# Patient Record
Sex: Male | Born: 1980
Health system: Southern US, Community
[De-identification: ages and names within clinical notes are randomized; demographics above are authoritative.]

## PROBLEM LIST (undated history)

## (undated) DIAGNOSIS — U071 COVID-19: Secondary | ICD-10-CM

## (undated) DIAGNOSIS — Z800 Family history of malignant neoplasm of digestive organs: Secondary | ICD-10-CM

## (undated) DIAGNOSIS — R229 Localized swelling, mass and lump, unspecified: Secondary | ICD-10-CM

## (undated) DIAGNOSIS — I10 Essential (primary) hypertension: Secondary | ICD-10-CM

## (undated) DIAGNOSIS — G4730 Sleep apnea, unspecified: Secondary | ICD-10-CM

## (undated) DIAGNOSIS — R7303 Prediabetes: Secondary | ICD-10-CM

## (undated) HISTORY — DX: Localized swelling, mass and lump, unspecified: R22.9

## (undated) HISTORY — PX: VASECTOMY: SHX75

## (undated) HISTORY — PX: COLONOSCOPY: SHX174

## (undated) HISTORY — DX: Family history of malignant neoplasm of digestive organs: Z80.0

## (undated) HISTORY — PX: LIPOMA EXCISION: SHX5283

---

## 2009-03-05 HISTORY — PX: TOE SURGERY: SHX1073

## 2011-03-06 HISTORY — PX: BUNIONECTOMY: SHX129

## 2013-03-10 LAB — HEPATIC FUNCTION PANEL
ALT: 36 (ref 10–40)
AST: 19 (ref 14–40)
Bilirubin, Total: 0.6

## 2013-03-10 LAB — BASIC METABOLIC PANEL
BUN: 15 (ref 4–21)
CREATININE: 1 (ref 0.6–1.3)

## 2013-03-10 LAB — LIPID PANEL
Cholesterol: 213 — AB (ref 0–200)
HDL: 50 (ref 35–70)
TRIGLYCERIDES: 113 (ref 40–160)

## 2013-06-30 ENCOUNTER — Telehealth: Payer: Self-pay | Admitting: Genetic Counselor

## 2013-06-30 NOTE — Telephone Encounter (Signed)
CALLED PATIENT TO SCHEDULE GENETIC APPT PER PATIENT WILL CALL BACK.

## 2013-07-29 ENCOUNTER — Ambulatory Visit (INDEPENDENT_AMBULATORY_CARE_PROVIDER_SITE_OTHER): Payer: BC Managed Care – PPO | Admitting: General Surgery

## 2013-07-29 ENCOUNTER — Encounter (INDEPENDENT_AMBULATORY_CARE_PROVIDER_SITE_OTHER): Payer: Self-pay | Admitting: General Surgery

## 2013-07-29 VITALS — BP 118/78 | HR 71 | Temp 97.9°F | Resp 16 | Ht 76.0 in | Wt 236.4 lb

## 2013-07-29 DIAGNOSIS — R1900 Intra-abdominal and pelvic swelling, mass and lump, unspecified site: Secondary | ICD-10-CM

## 2013-07-29 DIAGNOSIS — R222 Localized swelling, mass and lump, trunk: Secondary | ICD-10-CM

## 2013-07-29 NOTE — Progress Notes (Signed)
Patient ID: Carl Rivera, male   DOB: August 17, 1980, 33 y.o.   MRN: [id number]  No chief complaint on file.   HPI Carl Rivera is a 33 y.o. male.  The patient is a 33 year old male who is referred by Dr. Kelton Pillar for evaluation of a right abdominal wall cyst. Patient states been there for at least a year and get an more painful over the last several weeks. It's very tender to touch. He states it is bothering his sleeping pattern. He states it slowly getting bigger.2  HPI  Past Medical History  Diagnosis Date  . Localized superficial swelling, mass, or lump     Past Surgical History  Procedure Laterality Date  . Bunionectomy  2013  . Lipoma excision      x2    No family history on file.  Social History History  Substance Use Topics  . Smoking status: Never Smoker   . Smokeless tobacco: Not on file  . Alcohol Use: Not on file    No Known Allergies  Current Outpatient Prescriptions  Medication Sig Dispense Refill  . FLUTICASONE PROPIONATE, NASAL, NA Place into the nose.       No current facility-administered medications for this visit.    Review of Systems Review of Systems  Constitutional: Negative.   HENT: Negative.   Eyes: Negative.   Respiratory: Negative.   Cardiovascular: Negative.   Gastrointestinal: Negative.   Endocrine: Negative.   Neurological: Negative.     Blood pressure 118/78, pulse 71, temperature 97.9 F (36.6 C), temperature source Temporal, resp. rate 16, height 6\' 4"  (1.93 m), weight 236 lb 6.4 oz (107.23 kg).  Physical Exam Physical Exam  Constitutional: He is oriented to person, place, and time. He appears well-developed and well-nourished.  HENT:  Head: Normocephalic and atraumatic.  Eyes: Conjunctivae and EOM are normal. Pupils are equal, round, and reactive to light.  Neck: Normal range of motion. Neck supple.  Cardiovascular: Normal rate, regular rhythm and normal heart sounds.   Pulmonary/Chest: Effort normal and breath sounds normal.    Abdominal:    Musculoskeletal: Normal range of motion.  Neurological: He is alert and oriented to person, place, and time.  Skin: Skin is warm and dry.    Data Reviewed none  Assessment    33 year old male with a right lateral abdominal wall mass     Plan    1. We'll proceed to the operating room for an excision of a right lateral abdominal wall mass. 2. Discussed the risks and benefits of the procedure to include infection, bleeding, damage to structures strictures, nerve pain, possible recurrence. The patient voiced understanding and wishes to proceed.        Ralene Ok 07/29/2013, 9:26 AM

## 2013-08-05 ENCOUNTER — Other Ambulatory Visit: Payer: Federal, State, Local not specified - PPO

## 2013-08-05 ENCOUNTER — Ambulatory Visit (HOSPITAL_BASED_OUTPATIENT_CLINIC_OR_DEPARTMENT_OTHER): Payer: Federal, State, Local not specified - PPO | Admitting: Genetic Counselor

## 2013-08-05 DIAGNOSIS — Z800 Family history of malignant neoplasm of digestive organs: Secondary | ICD-10-CM | POA: Insufficient documentation

## 2013-08-05 DIAGNOSIS — IMO0002 Reserved for concepts with insufficient information to code with codable children: Secondary | ICD-10-CM

## 2013-08-05 DIAGNOSIS — D126 Benign neoplasm of colon, unspecified: Secondary | ICD-10-CM | POA: Insufficient documentation

## 2013-08-05 NOTE — Progress Notes (Signed)
HISTORY OF PRESENT ILLNESS: Dr. Wynetta Emery requested a cancer genetics consultation for Carl Rivera, a 33 y.o. male, due to a family history of colorectal cancer an colon polyps.  Carl Rivera presents to clinic today to discuss the possibility of a hereditary predisposition to cancer, genetic testing, and to further clarify his future cancer risks, as well as potential cancer risk for family members. Carl Rivera has no personal history of cancer. He had a recent colonoscopy on 06/03/2013 and this identified one benign polyp. He reports having two previous colonoscopies as normal. He has not had a EGD.  Past Medical History  Diagnosis Date   Localized superficial swelling, mass, or lump    Family history of colon cancer     Past Surgical History  Procedure Laterality Date   Bunionectomy  2013   Lipoma excision      x2   History   Social History   Marital Status: Married    Spouse Name: N/A    Number of Children: N/A   Years of Education: N/A   Social History Main Topics   Smoking status: Never Smoker    Smokeless tobacco: Not on file   Alcohol Use: 0.6 - 1.2 oz/week    1-2 Glasses of wine per week   Drug Use: Not on file   Sexual Activity: Not on file   Other Topics Concern   Not on file   Social History Narrative   No narrative on file     FAMILY HISTORY:  During the visit, a 4-generation pedigree was obtained. Significant diagnoses include the following:  Family History  Problem Relation Age of Onset   Cancer Paternal Grandmother 73    colon cancer; breast cancer in 75s   Familial polyposis Father     multiple polyps (75-60) ; unknown pathology; no cancer   Cancer Paternal Uncle 37    colorectal cancer   Familial polyposis Paternal Uncle 39    polyp history; unknown type or number   Cancer Other 22    1st cousin once removed (pat grandmother's sister's son) with colorectal cancer    Carl Rivera ancestry is of Caucasian descent. There is no known  Jewish ancestry or consanguinity.  GENETIC COUNSELING ASSESSMENT: Carl Rivera is a 33 y.o. male with a family history of colon polyps and colon cancer suggestive of a hereditary predisposition to cancer. We, therefore, discussed and recommended the following at today's visit.   DISCUSSION: We reviewed the characteristics, features and inheritance patterns of hereditary colorectal cancer syndromes, specifically discussing polyposis conditions given the number of polyps identified in his father. We also discussed genetic testing, including the appropriate family members to test, the process of testing, insurance coverage and turn-around-time for results. We discussed the implications of a negative and positive result. We recommended Carl Rivera pursue genetic testing for the APC gene (associated with familial adenomatous polyposis syndrome) and the MYH gene (associated with MYH-associated polyposis).   PLAN: Based on our above recommendation, Carl Rivera wished to pursue genetic testing and the blood sample was drawn and will be sent to [facility] for analysis. Results should be available within approximately 5 weeks time, at which point they will be disclosed by telephone to Carl Rivera, as will any additional recommendations warranted by these results.   Based on Carl Rivera family history, we recommended his father, who was diagnosed with multiple colon polyps have genetic counseling and testing. We discussed that it is always most informative to initiate genetic testing in  a family member diagnosed with multiple polyps and/or colon cancer is possible. Carl Rivera will speak with his father and let us know if we can be of any assistance in coordinating genetic counseling and/or testing.   We also encouraged Carl Rivera to remain in contact with cancer genetics annually so that we can continuously update the family history and inform him of any changes in cancer genetics and testing that may be  of benefit for this family. Mr.  Rivera questions were answered to his satisfaction today. Our contact information was provided should additional questions or concerns arise.   Thank you for the referral and allowing [place] to share in the care of your patient.   The patient was seen for a total of 45 minutes in face-to-face genetic counseling.  This patient was discussed with Dr. Jana Hakim who agrees with the above.    _______________________________________________________________________ For Office Staff:  Number of people involved in session: 2 Was an Intern/ student involved with case: no

## 2013-09-12 ENCOUNTER — Emergency Department (HOSPITAL_BASED_OUTPATIENT_CLINIC_OR_DEPARTMENT_OTHER)
Admission: EM | Admit: 2013-09-12 | Discharge: 2013-09-12 | Disposition: A | Discharge status: Home / Self Care | Payer: Federal, State, Local not specified - PPO | Attending: Emergency Medicine | Admitting: Emergency Medicine

## 2013-09-12 ENCOUNTER — Encounter (HOSPITAL_BASED_OUTPATIENT_CLINIC_OR_DEPARTMENT_OTHER): Payer: Self-pay | Admitting: Emergency Medicine

## 2013-09-12 ENCOUNTER — Emergency Department (HOSPITAL_BASED_OUTPATIENT_CLINIC_OR_DEPARTMENT_OTHER): Payer: Federal, State, Local not specified - PPO

## 2013-09-12 DIAGNOSIS — Y9301 Activity, walking, marching and hiking: Secondary | ICD-10-CM | POA: Insufficient documentation

## 2013-09-12 DIAGNOSIS — S93609A Unspecified sprain of unspecified foot, initial encounter: Secondary | ICD-10-CM | POA: Insufficient documentation

## 2013-09-12 DIAGNOSIS — W108XXA Fall (on) (from) other stairs and steps, initial encounter: Secondary | ICD-10-CM | POA: Insufficient documentation

## 2013-09-12 DIAGNOSIS — Z791 Long term (current) use of non-steroidal anti-inflammatories (NSAID): Secondary | ICD-10-CM | POA: Insufficient documentation

## 2013-09-12 DIAGNOSIS — IMO0002 Reserved for concepts with insufficient information to code with codable children: Secondary | ICD-10-CM | POA: Insufficient documentation

## 2013-09-12 DIAGNOSIS — S93602A Unspecified sprain of left foot, initial encounter: Secondary | ICD-10-CM

## 2013-09-12 DIAGNOSIS — Y929 Unspecified place or not applicable: Secondary | ICD-10-CM | POA: Insufficient documentation

## 2013-09-12 MED ORDER — HYDROCODONE-ACETAMINOPHEN 5-325 MG PO TABS: 1.0000 | ORAL_TABLET | Freq: Four times a day (QID) | ORAL | Status: DC | PRN

## 2013-09-12 MED ORDER — IBUPROFEN 800 MG PO TABS
800.0000 mg | ORAL_TABLET | Freq: Once | ORAL | Status: AC
  Administered 2013-09-12: 800 mg via ORAL
  Filled 2013-09-12: qty 1

## 2013-09-12 NOTE — ED Provider Notes (Signed)
CSN: [id number]     Arrival date & time 09/12/13  0026 History   First MD Initiated Contact with Patient 09/12/13 0108     Chief Complaint  Patient presents with  . Foot Injury     (Consider location/radiation/quality/duration/timing/severity/associated sxs/prior Treatment) HPI This is a 33 year old male who was walking up the steps about 9 PM when he missed a step. He believes that this is the cause of pain he subsequently developed in his left foot. The pain is located in the dorsal, medial aspect of the foot. It is moderate to severe, and severe enough that he is unable to bear weight on that foot. Pain is also worse with palpation or movement. There is no associated deformity, swelling, ecchymosis or ankle pain.  Past Medical History  Diagnosis Date  . Localized superficial swelling, mass, or lump   . Family history of colon cancer    Past Surgical History  Procedure Laterality Date  . Bunionectomy  2013  . Lipoma excision      x2   Family History  Problem Relation Age of Onset  . Cancer Paternal Grandmother 109    colon cancer; breast cancer in 32s  . Familial polyposis Father     multiple polyps (44-60) ; unknown pathology; no cancer  . Cancer Paternal Uncle 39    colorectal cancer  . Familial polyposis Paternal Uncle 81    polyp history; unknown type or number  . Cancer Other 1    1st cousin once removed (pat grandmother's sister's son) with colorectal cancer   History  Substance Use Topics  . Smoking status: Never Smoker   . Smokeless tobacco: Not on file  . Alcohol Use: 0.6 - 1.2 oz/week    1-2 Glasses of wine per week    Review of Systems  All other systems reviewed and are negative.   Allergies  Review of patient's allergies indicates no known allergies.  Home Medications   Prior to Admission medications   Medication Sig Start Date End Date Taking? Authorizing Provider  ibuprofen (ADVIL,MOTRIN) 400 MG tablet Take 400 mg by mouth every 6 (six) hours  as needed.   Yes Historical Provider, MD  FLUTICASONE PROPIONATE, NASAL, NA Place into the nose.    Historical Provider, MD   BP 140/80  Pulse 102  Temp(Src) 97.9 F (36.6 C) (Oral)  Resp 16  Ht 6' (1.829 m)  Wt 230 lb (104.327 kg)  BMI 31.19 kg/m2  SpO2 99%  Physical Exam General: Well-developed, well-nourished male in no acute distress; appearance consistent with age of record HENT: normocephalic; atraumatic Eyes: pupils equal, round and reactive to light; extraocular muscles intact Neck: supple Heart: regular rate and rhythm Lungs: clear to auscultation bilaterally Abdomen: soft; nondistended; nontender; bowel sounds present Extremities: No deformity; full range of motion except left ankle limited due to pain; pulses normal; tenderness over dorsal aspect of left foot without erythema, ecchymosis or swelling, foot distally neurovascularly intact Neurologic: Awake, alert and oriented; motor function intact in all extremities and symmetric; no facial droop Skin: Warm and dry Psychiatric: Normal mood and affect    ED Course  Procedures (including critical care time)  MDM  Nursing notes and vitals signs, including pulse oximetry, reviewed.  Summary of this visit's results, reviewed by myself:  Imaging Studies: Dg Foot Complete Left  09/12/2013   CLINICAL DATA:  Left foot pain at although are too.  No injury.  EXAM: LEFT FOOT - COMPLETE 3+ VIEW  COMPARISON:  None.  FINDINGS: There is no evidence of fracture or dislocation. There is no evidence of arthropathy or other focal bone abnormality. Soft tissues are unremarkable.  IMPRESSION: Negative.   Electronically Signed   By: Lucienne Capers M.D.   On: 09/12/2013 01:13   History and exam consistent with sprain of left foot.      Wynetta Fines, MD 09/12/13 [phone]

## 2013-09-12 NOTE — Discharge Instructions (Signed)
Foot Sprain The muscles and cord like structures which attach muscle to bone (tendons) that surround the feet are made up of units. A foot sprain can occur at the weakest spot in any of these units. This condition is most often caused by injury to or overuse of the foot, as from playing contact sports, or aggravating a previous injury, or from poor conditioning, or obesity. SYMPTOMS  Pain with movement of the foot.  Tenderness and swelling at the injury site.  Loss of strength is present in moderate or severe sprains. THE THREE GRADES OR SEVERITY OF FOOT SPRAIN ARE:  Mild (Grade I): Slightly pulled muscle without tearing of muscle or tendon fibers or loss of strength.  Moderate (Grade II): Tearing of fibers in a muscle, tendon, or at the attachment to bone, with small decrease in strength.  Severe (Grade III): Rupture of the muscle-tendon-bone attachment, with separation of fibers. Severe sprain requires surgical repair. Often repeating (chronic) sprains are caused by overuse. Sudden (acute) sprains are caused by direct injury or over-use. DIAGNOSIS  Diagnosis of this condition is usually by your own observation. If problems continue, a caregiver may be required for further evaluation and treatment. X-rays may be required to make sure there are not breaks in the bones (fractures) present. Continued problems may require physical therapy for treatment. PREVENTION  Use strength and conditioning exercises appropriate for your sport.  Warm up properly prior to working out.  Use athletic shoes that are made for the sport you are participating in.  Allow adequate time for healing. Early return to activities makes repeat injury more likely, and can lead to an unstable arthritic foot that can result in prolonged disability. Mild sprains generally heal in 3 to 10 days, with moderate and severe sprains taking 2 to 10 weeks. Your caregiver can help you determine the proper time required for  healing. HOME CARE INSTRUCTIONS   Apply ice to the injury for 15-20 minutes, 03-04 times per day. Put the ice in a plastic bag and place a towel between the bag of ice and your skin.  An elastic wrap (like an Ace bandage) may be used to keep swelling down.  Keep foot above the level of the heart, or at least raised on a footstool, when swelling and pain are present.  Try to avoid use other than gentle range of motion while the foot is painful. Do not resume use until instructed by your caregiver. Then begin use gradually, not increasing use to the point of pain. If pain does develop, decrease use and continue the above measures, gradually increasing activities that do not cause discomfort, until you gradually achieve normal use.  Use crutches if and as instructed, and for the length of time instructed.  Keep injured foot and ankle wrapped between treatments.  Massage foot and ankle for comfort and to keep swelling down. Massage from the toes up towards the knee.  Only take over-the-counter or prescription medicines for pain, discomfort, or fever as directed by your caregiver. SEEK IMMEDIATE MEDICAL CARE IF:   Your pain and swelling increase, or pain is not controlled with medications.  You have loss of feeling in your foot or your foot turns cold or blue.  You develop new, unexplained symptoms, or an increase of the symptoms that brought you to your caregiver. MAKE SURE YOU:   Understand these instructions.  Will watch your condition.  Will get help right away if you are not doing well or get worse. Document Released:   08/11/2001 Document Revised: 05/14/2011 Document Reviewed: 10/09/2007 ExitCare Patient Information 2015 [employer]. This information is not intended to replace advice given to you by your health care provider. Make sure you discuss any questions you have with your health care provider.  

## 2013-09-12 NOTE — ED Notes (Signed)
Pt c/o left foot pain x 3 hrs

## 2013-09-12 NOTE — ED Notes (Signed)
Patient transported to X-ray 

## 2013-09-15 ENCOUNTER — Other Ambulatory Visit (INDEPENDENT_AMBULATORY_CARE_PROVIDER_SITE_OTHER): Payer: Self-pay

## 2013-09-15 DIAGNOSIS — D1739 Benign lipomatous neoplasm of skin and subcutaneous tissue of other sites: Secondary | ICD-10-CM

## 2013-09-15 MED ORDER — OXYCODONE-ACETAMINOPHEN 5-325 MG PO TABS: 1.0000 | ORAL_TABLET | ORAL | Status: DC | PRN

## 2013-09-17 ENCOUNTER — Telehealth (INDEPENDENT_AMBULATORY_CARE_PROVIDER_SITE_OTHER): Payer: Self-pay

## 2013-09-17 NOTE — Telephone Encounter (Signed)
Called pt to see how he is doing after sx. Pt states that he has been doing very well. Encouraged pt that if he needs anything just call our office. Pt verbalized understanding.

## 2013-10-05 ENCOUNTER — Encounter (INDEPENDENT_AMBULATORY_CARE_PROVIDER_SITE_OTHER): Payer: BC Managed Care – PPO | Admitting: General Surgery

## 2013-10-08 ENCOUNTER — Encounter (INDEPENDENT_AMBULATORY_CARE_PROVIDER_SITE_OTHER): Payer: Self-pay | Admitting: General Surgery

## 2013-11-17 NOTE — Progress Notes (Signed)
11/17/2013: received word that Carl Rivera has cancelled his genetic test due to an out of pocket cost he does not wish to pay. We understand this decision and remain available to help recoordinate testing at any time should he wish to pursue it in the future.

## 2014-12-09 ENCOUNTER — Other Ambulatory Visit: Payer: Self-pay | Admitting: Internal Medicine

## 2014-12-09 ENCOUNTER — Ambulatory Visit
Admission: RE | Admit: 2014-12-09 | Discharge: 2014-12-09 | Disposition: A | Discharge status: Home / Self Care | Payer: Federal, State, Local not specified - PPO | Source: Ambulatory Visit | Attending: Internal Medicine | Admitting: Internal Medicine

## 2014-12-09 DIAGNOSIS — R0602 Shortness of breath: Secondary | ICD-10-CM

## 2015-10-11 LAB — LIPID PANEL
Cholesterol: 211 — AB (ref 0–200)
HDL: 52 (ref 35–70)
LDL Cholesterol: 134
Triglycerides: 125 (ref 40–160)

## 2015-10-11 LAB — HEPATIC FUNCTION PANEL
ALT: 28 (ref 10–40)
AST: 18 (ref 14–40)
BILIRUBIN, TOTAL: 0.5

## 2015-10-11 LAB — BASIC METABOLIC PANEL
BUN: 12 (ref 4–21)
CREATININE: 1 (ref 0.6–1.3)
Glucose: 101
POTASSIUM: 4.1 (ref 3.4–5.3)
Sodium: 140 (ref 137–147)

## 2015-10-11 LAB — CBC AND DIFFERENTIAL
HCT: 46 (ref 41–53)
Hemoglobin: 15.4 (ref 13.5–17.5)
Platelets: 154 (ref 150–399)
WBC: 5.6

## 2015-10-11 LAB — HEMOGLOBIN A1C: Hemoglobin A1C: 5.7

## 2015-10-11 LAB — TSH: TSH: 2.26 (ref 0.41–5.90)

## 2016-02-17 DIAGNOSIS — J209 Acute bronchitis, unspecified: Secondary | ICD-10-CM | POA: Diagnosis not present

## 2016-04-13 DIAGNOSIS — J019 Acute sinusitis, unspecified: Secondary | ICD-10-CM | POA: Diagnosis not present

## 2016-12-22 NOTE — Progress Notes (Addendum)
[facility] [address] [place], [place] [address] [phone]  Date:  12/24/2016   Name:  Carl Rivera   DOB:  [birth year]   MRN:  [id number]  PCP:  Lottie Dawson, MD    Chief Complaint: Establish Care (Pt here to est care. Flu vaccine given today. )   History of Present Illness:  Carl Rivera is a 36 y.o. very pleasant male patient who presents with the following:  Here today as a new patient to establish care, but is not new to town.  He was not happy with his last primary care provider and wants to try our office He had a lipoma removed from his abd wall a few years ago- benign There is a family history of colon cancer, "big time," but they are not sure if there is a genetic component as testing has not been done. However it does sound like they have a genetic predisposition to colon cancer given the history  He has been doing colonoscopy every 3 years,  His dad has pre-cancerous polyps, brother (younger) has had several pre-cancerous polyps. A cousin had colon cancer at 69 yo, and 2 of his 3 dad's brothers have had colon cancer in their 46s as well.   We will request records from his last PCP today  Labs: would like to do today- he is fasting today Flu: done today Tetanus: he has had several children so we think he is UTD on his tdap, he will check with his wife  He is an Forensic psychologist,  Nurse, adult, federal indigent defense work.  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   He is only getting 4-5 hours of sleep per night and admits this could be part of his memory issue.  He does think he could try and get more sleep if he changed a few things.   He does snore- notes that this becomes an issue when he gains weight.  He will try to lose about 15 lbs in hopes of resolving this  Patient Active Problem List   Diagnosis Date Noted  . Overweight 12/24/2016  . Benign neoplasm of colon 08/05/2013  . Family history of malignant neoplasm of gastrointestinal tract 08/05/2013    Past Medical History:  Diagnosis Date  . Family history of colon cancer   . Localized superficial swelling, mass, or lump     Past Surgical History:  Procedure Laterality Date  . BUNIONECTOMY  2013  .  LIPOMA EXCISION     x2    Social History  Substance Use Topics  . Smoking status: Never Smoker  . Smokeless tobacco: Not on file  . Alcohol use 0.6 - 1.2 oz/week    1 - 2 Glasses of wine per week    Family History  Problem Relation Age of Onset  . Familial polyposis Father        multiple polyps (68-60) ; unknown pathology; no cancer  . Cancer Paternal Grandmother 8       colon cancer; breast cancer in 49s  . Cancer Paternal Uncle 20       colorectal cancer  . Familial polyposis Paternal Uncle 74       polyp history; unknown type or number  . Cancer Other 31       1st cousin once removed (pat grandmother's sister's son) with colorectal cancer    No Known Allergies  Medication list has been reviewed and updated.  No current outpatient prescriptions on file prior to visit.   No current facility-administered medications on file prior to visit.     Review of Systems:  As  per HPI- otherwise negative.  No fever or chills Had some body aches thought due to work- out  No GI or urinary concerns  Physical Examination: Vitals:   12/24/16 0840  BP: 128/88  Pulse: 67  Temp: 98.2 F (36.8 C)  SpO2: 98%   Vitals:   12/24/16 0840  Weight: 237 lb 6.4 oz (107.7 kg)  Height: 6\' 3"  (1.905 m)   Body mass index is 29.67 kg/m. Ideal Body Weight: Weight in (lb) to have BMI = 25: 199.6  GEN: WDWN, NAD, Non-toxic, A & O x 3, tall build, overweight, looks well HEENT: Atraumatic, Normocephalic. Neck supple. No masses, No LAD.  Bilateral TM wnl, oropharynx normal.  PEERL,EOMI.  Ears and Nose: No external deformity. CV: RRR, No M/G/R. No JVD. No thrill. No extra heart sounds. PULM: CTA B, no wheezes, crackles, rhonchi. No retractions. No resp. distress. No accessory muscle use. ABD: S, NT, ND. No rebound. No HSM. EXTR: No c/c/e NEURO Normal gait.  PSYCH: Normally interactive. Conversant. Not depressed or anxious appearing.  Calm demeanor.    Assessment and Plan: Physical exam  Immunization due - Plan: Flu Vaccine QUAD 36+ mos IM (Fluarix & Fluzone Quad PF  Screening for deficiency anemia - Plan: CBC  Screening for diabetes mellitus - Plan: Comprehensive metabolic panel, Hemoglobin A1c  Screening for hyperlipidemia - Plan: Lipid panel  Other fatigue - Plan: TSH  Overweight  Establish care/ CPE today Flu shot done Labs pending as above Encouraged him to try and sleep at least 6 hours a night He will work on moderate weight loss Will plan further follow- up pending labs.    Signed Lamar Blinks, MD  Received his labs- leter to pt  Results for orders placed or performed in visit on 12/24/16  CBC  Result Value Ref Range   WBC 7.7 4.0 - 10.5 K/uL   RBC 5.11 4.22 - 5.81 Mil/uL   Platelets 134.0 (L) 150.0 - 400.0 K/uL   Hemoglobin 15.4 13.0 - 17.0 g/dL   HCT 44.6 39.0 - 52.0 %   MCV 87.1 78.0 - 100.0 fl   MCHC 34.7 30.0 - 36.0 g/dL   RDW  13.5 11.5 - 15.5 %  Comprehensive metabolic panel  Result Value Ref Range   Sodium 140 135 - 145 mEq/L   Potassium 3.9 3.5 - 5.1 mEq/L  Chloride 104 96 - 112 mEq/L   CO2 29 19 - 32 mEq/L   Glucose, Bld 110 (H) 70 - 99 mg/dL   BUN 11 6 - 23 mg/dL   Creatinine, Ser 0.78 0.40 - 1.50 mg/dL   Total Bilirubin 0.6 0.2 - 1.2 mg/dL   Alkaline Phosphatase 58 39 - 117 U/L   AST 76 (H) 0 - 37 U/L   ALT 45 0 - 53 U/L   Total Protein 7.2 6.0 - 8.3 g/dL   Albumin 4.3 3.5 - 5.2 g/dL   Calcium 9.4 8.4 - 10.5 mg/dL   GFR 120.30 >60.00 mL/min  Hemoglobin A1c  Result Value Ref Range   Hgb A1c MFr Bld 5.8 4.6 - 6.5 %  Lipid panel  Result Value Ref Range   Cholesterol 186 0 - 200 mg/dL   Triglycerides 91.0 0.0 - 149.0 mg/dL   HDL 51.60 >39.00 mg/dL   VLDL 18.2 0.0 - 40.0 mg/dL   LDL Cholesterol 116 (H) 0 - 99 mg/dL   Total CHOL/HDL Ratio 4    NonHDL 134.43   TSH  Result Value Ref Range   TSH 1.41 0.35 - 4.50 uIU/mL

## 2016-12-24 ENCOUNTER — Encounter: Payer: Self-pay | Admitting: Family Medicine

## 2016-12-24 ENCOUNTER — Ambulatory Visit (INDEPENDENT_AMBULATORY_CARE_PROVIDER_SITE_OTHER): Payer: Federal, State, Local not specified - PPO | Admitting: Family Medicine

## 2016-12-24 VITALS — BP 128/88 | HR 67 | Temp 98.2°F | Ht 75.0 in | Wt 237.4 lb

## 2016-12-24 DIAGNOSIS — R740 Nonspecific elevation of levels of transaminase and lactic acid dehydrogenase [LDH]: Secondary | ICD-10-CM | POA: Diagnosis not present

## 2016-12-24 DIAGNOSIS — E663 Overweight: Secondary | ICD-10-CM | POA: Insufficient documentation

## 2016-12-24 DIAGNOSIS — Z131 Encounter for screening for diabetes mellitus: Secondary | ICD-10-CM | POA: Diagnosis not present

## 2016-12-24 DIAGNOSIS — Z13220 Encounter for screening for lipoid disorders: Secondary | ICD-10-CM

## 2016-12-24 DIAGNOSIS — Z23 Encounter for immunization: Secondary | ICD-10-CM

## 2016-12-24 DIAGNOSIS — R5383 Other fatigue: Secondary | ICD-10-CM | POA: Diagnosis not present

## 2016-12-24 DIAGNOSIS — Z130 Encounter for screening for diseases of the blood and blood-forming organs and certain disorders involving the immune mechanism: Secondary | ICD-10-CM

## 2016-12-24 DIAGNOSIS — Z0000 Encounter for general adult medical examination without abnormal findings: Secondary | ICD-10-CM

## 2016-12-24 DIAGNOSIS — R7401 Elevation of levels of liver transaminase levels: Secondary | ICD-10-CM

## 2016-12-24 LAB — LIPID PANEL
CHOL/HDL RATIO: 4
CHOLESTEROL: 186 mg/dL (ref 0–200)
HDL: 51.6 mg/dL (ref >39.00)
LDL CALC: 116 mg/dL — AB (ref 0–99)
NonHDL: 134.43
Triglycerides: 91 mg/dL (ref 0.0–149.0)
VLDL: 18.2 mg/dL (ref 0.0–40.0)

## 2016-12-24 LAB — COMPREHENSIVE METABOLIC PANEL
ALBUMIN: 4.3 g/dL (ref 3.5–5.2)
ALK PHOS: 58 U/L (ref 39–117)
ALT: 45 U/L (ref 0–53)
AST: 76 U/L — AB (ref 0–37)
BILIRUBIN TOTAL: 0.6 mg/dL (ref 0.2–1.2)
BUN: 11 mg/dL (ref 6–23)
CALCIUM: 9.4 mg/dL (ref 8.4–10.5)
CO2: 29 meq/L (ref 19–32)
CREATININE: 0.78 mg/dL (ref 0.40–1.50)
Chloride: 104 mEq/L (ref 96–112)
GFR: 120.3 mL/min (ref >60.00)
Glucose, Bld: 110 mg/dL — ABNORMAL HIGH (ref 70–99)
Potassium: 3.9 mEq/L (ref 3.5–5.1)
Sodium: 140 mEq/L (ref 135–145)
TOTAL PROTEIN: 7.2 g/dL (ref 6.0–8.3)

## 2016-12-24 LAB — CBC
HCT: 44.6 % (ref 39.0–52.0)
HEMOGLOBIN: 15.4 g/dL (ref 13.0–17.0)
MCHC: 34.7 g/dL (ref 30.0–36.0)
MCV: 87.1 fl (ref 78.0–100.0)
PLATELETS: 134 10*3/uL — AB (ref 150.0–400.0)
RBC: 5.11 Mil/uL (ref 4.22–5.81)
RDW: 13.5 % (ref 11.5–15.5)
WBC: 7.7 10*3/uL (ref 4.0–10.5)

## 2016-12-24 LAB — TSH: TSH: 1.41 u[IU]/mL (ref 0.35–4.50)

## 2016-12-24 LAB — HEMOGLOBIN A1C: HEMOGLOBIN A1C: 5.8 % (ref 4.6–6.5)

## 2016-12-24 NOTE — Patient Instructions (Addendum)
It was nice to meet you today- best of luck with your upcoming move and the new baby!  I will be in touch with your labs asap  You got your flu shot today Please double check about your "tdap" shot with your wife- you will want to be up to date on this immunization before the new baby is born  Do work on getting a bit more sleep- most people do need at least 6-7 hours to function well, and you may find that your memory is better on a bit more rest!    Health Maintenance, Male A healthy lifestyle and preventive care is important for your health and wellness. Ask your health care provider about what schedule of regular examinations is right for you. What should I know about weight and diet? Eat a Healthy Diet  Eat plenty of vegetables, fruits, whole grains, low-fat dairy products, and lean protein.  Do not eat a lot of foods high in solid fats, added sugars, or salt.  Maintain a Healthy Weight Regular exercise can help you achieve or maintain a healthy weight. You should:  Do at least 150 minutes of exercise each week. The exercise should increase your heart rate and make you sweat (moderate-intensity exercise).  Do strength-training exercises at least twice a week.  Watch Your Levels of Cholesterol and Blood Lipids  Have your blood tested for lipids and cholesterol every 5 years starting at 35 years of age. If you are at high risk for heart disease, you should start having your blood tested when you are 35 years old. You may need to have your cholesterol levels checked more often if: ? Your lipid or cholesterol levels are high. ? You are older than 35 years of age. ? You are at high risk for heart disease.  What should I know about cancer screening? Many types of cancers can be detected early and may often be prevented. Lung Cancer  You should be screened every year for lung cancer if: ? You are a current smoker who has smoked for at least 30 years. ? You are a former smoker who  has quit within the past 15 years.  Talk to your health care provider about your screening options, when you should start screening, and how often you should be screened.  Colorectal Cancer  Routine colorectal cancer screening usually begins at 35 years of age and should be repeated every 5-10 years until you are 35 years old. You may need to be screened more often if early forms of precancerous polyps or small growths are found. Your health care provider may recommend screening at an earlier age if you have risk factors for colon cancer.  Your health care provider may recommend using home test kits to check for hidden blood in the stool.  A small camera at the end of a tube can be used to examine your colon (sigmoidoscopy or colonoscopy). This checks for the earliest forms of colorectal cancer.  Prostate and Testicular Cancer  Depending on your age and overall health, your health care provider may do certain tests to screen for prostate and testicular cancer.  Talk to your health care provider about any symptoms or concerns you have about testicular or prostate cancer.  Skin Cancer  Check your skin from head to toe regularly.  Tell your health care provider about any new moles or changes in moles, especially if: ? There is a change in a mole's size, shape, or color. ? You have a  mole that is larger than a pencil eraser.  Always use sunscreen. Apply sunscreen liberally and repeat throughout the day.  Protect yourself by wearing long sleeves, pants, a wide-brimmed hat, and sunglasses when outside.  What should I know about heart disease, diabetes, and high blood pressure?  If you are 39-44 years of age, have your blood pressure checked every 3-5 years. If you are 13 years of age or older, have your blood pressure checked every year. You should have your blood pressure measured twice-once when you are at a hospital or clinic, and once when you are not at a hospital or clinic. Record the  average of the two measurements. To check your blood pressure when you are not at a hospital or clinic, you can use: ? An automated blood pressure machine at a pharmacy. ? A home blood pressure monitor.  Talk to your health care provider about your target blood pressure.  If you are between 76-46 years old, ask your health care provider if you should take aspirin to prevent heart disease.  Have regular diabetes screenings by checking your fasting blood sugar level. ? If you are at a normal weight and have a low risk for diabetes, have this test once every three years after the age of 46. ? If you are overweight and have a high risk for diabetes, consider being tested at a younger age or more often.  A one-time screening for abdominal aortic aneurysm (AAA) by ultrasound is recommended for men aged 79-75 years who are current or former smokers. What should I know about preventing infection? Hepatitis B If you have a higher risk for hepatitis B, you should be screened for this virus. Talk with your health care provider to find out if you are at risk for hepatitis B infection. Hepatitis C Blood testing is recommended for:  Everyone born from [birth year] through 1964.  Anyone with known risk factors for hepatitis C.  Sexually Transmitted Diseases (STDs)  You should be screened each year for STDs including gonorrhea and chlamydia if: ? You are sexually active and are younger than 35 years of age. ? You are older than 35 years of age and your health care provider tells you that you are at risk for this type of infection. ? Your sexual activity has changed since you were last screened and you are at an increased risk for chlamydia or gonorrhea. Ask your health care provider if you are at risk.  Talk with your health care provider about whether you are at high risk of being infected with HIV. Your health care provider may recommend a prescription medicine to help prevent HIV infection.  What else can  I do?  Schedule regular health, dental, and eye exams.  Stay current with your vaccines (immunizations).  Do not use any tobacco products, such as cigarettes, chewing tobacco, and e-cigarettes. If you need help quitting, ask your health care provider.  Limit alcohol intake to no more than 2 drinks per day. One drink equals 12 ounces of beer, 5 ounces of wine, or 1 ounces of hard liquor.  Do not use street drugs.  Do not share needles.  Ask your health care provider for help if you need support or information about quitting drugs.  Tell your health care provider if you often feel depressed.  Tell your health care provider if you have ever been abused or do not feel safe at home. This information is not intended to replace advice given to you by  your health care provider. Make sure you discuss any questions you have with your health care provider. Document Released: 08/18/2007 Document Revised: 10/19/2015 Document Reviewed: 11/23/2014 Elsevier Interactive Patient Education  [employer].

## 2016-12-26 ENCOUNTER — Encounter: Payer: Self-pay | Admitting: Family Medicine

## 2016-12-26 NOTE — Addendum Note (Signed)
Addended by: Lamar Blinks C on: 12/26/2016 02:15 PM   Modules accepted: Orders

## 2016-12-27 ENCOUNTER — Telehealth: Payer: Self-pay | Admitting: Family Medicine

## 2016-12-27 ENCOUNTER — Encounter: Payer: Self-pay | Admitting: Family Medicine

## 2016-12-27 DIAGNOSIS — Z3009 Encounter for other general counseling and advice on contraception: Secondary | ICD-10-CM

## 2016-12-27 NOTE — Telephone Encounter (Signed)
Relation to VB:TYOM Call back number: [phone]   Reason for call:  Patient requesting referral to urologist for vasectomy, please advise

## 2017-01-01 NOTE — Telephone Encounter (Signed)
Referral sent on 12/27/16.

## 2017-01-06 ENCOUNTER — Encounter: Payer: Self-pay | Admitting: Family Medicine

## 2017-01-06 ENCOUNTER — Telehealth: Payer: Self-pay | Admitting: Family Medicine

## 2017-01-06 DIAGNOSIS — R945 Abnormal results of liver function studies: Secondary | ICD-10-CM

## 2017-01-06 DIAGNOSIS — R7989 Other specified abnormal findings of blood chemistry: Secondary | ICD-10-CM

## 2017-01-06 DIAGNOSIS — D696 Thrombocytopenia, unspecified: Secondary | ICD-10-CM

## 2017-01-06 NOTE — Telephone Encounter (Signed)
Received records from [place]- will scan and abstract as needed

## 2017-01-08 ENCOUNTER — Encounter: Payer: Self-pay | Admitting: Family Medicine

## 2017-01-10 ENCOUNTER — Encounter: Payer: Self-pay | Admitting: Family Medicine

## 2017-01-10 DIAGNOSIS — Z3009 Encounter for other general counseling and advice on contraception: Secondary | ICD-10-CM | POA: Diagnosis not present

## 2017-02-19 ENCOUNTER — Other Ambulatory Visit (INDEPENDENT_AMBULATORY_CARE_PROVIDER_SITE_OTHER): Payer: Federal, State, Local not specified - PPO

## 2017-02-19 ENCOUNTER — Encounter: Payer: Self-pay | Admitting: Family Medicine

## 2017-02-19 DIAGNOSIS — R7989 Other specified abnormal findings of blood chemistry: Secondary | ICD-10-CM

## 2017-02-19 DIAGNOSIS — D696 Thrombocytopenia, unspecified: Secondary | ICD-10-CM

## 2017-02-19 DIAGNOSIS — R945 Abnormal results of liver function studies: Secondary | ICD-10-CM | POA: Diagnosis not present

## 2017-02-19 DIAGNOSIS — R740 Nonspecific elevation of levels of transaminase and lactic acid dehydrogenase [LDH]: Secondary | ICD-10-CM

## 2017-02-19 DIAGNOSIS — R7401 Elevation of levels of liver transaminase levels: Secondary | ICD-10-CM

## 2017-02-19 LAB — HEPATIC FUNCTION PANEL
ALBUMIN: 4.4 g/dL (ref 3.5–5.2)
ALK PHOS: 58 U/L (ref 39–117)
ALT: 30 U/L (ref 0–53)
AST: 17 U/L (ref 0–37)
BILIRUBIN DIRECT: 0.1 mg/dL (ref 0.0–0.3)
BILIRUBIN TOTAL: 0.7 mg/dL (ref 0.2–1.2)
Total Protein: 7.3 g/dL (ref 6.0–8.3)

## 2017-02-19 LAB — CBC
HCT: 45.7 % (ref 39.0–52.0)
HEMOGLOBIN: 15.9 g/dL (ref 13.0–17.0)
MCHC: 34.7 g/dL (ref 30.0–36.0)
MCV: 86.4 fl (ref 78.0–100.0)
PLATELETS: 148 10*3/uL — AB (ref 150.0–400.0)
RBC: 5.29 Mil/uL (ref 4.22–5.81)
RDW: 13.2 % (ref 11.5–15.5)
WBC: 6.4 10*3/uL (ref 4.0–10.5)

## 2017-02-20 LAB — HEPATITIS PANEL, ACUTE
HEP B C IGM: NONREACTIVE
HEP B S AG: NONREACTIVE
HEP C AB: NONREACTIVE
Hep A IgM: NONREACTIVE
SIGNAL TO CUT-OFF: 0.03 (ref <1.00)

## 2017-03-08 DIAGNOSIS — Z302 Encounter for sterilization: Secondary | ICD-10-CM | POA: Diagnosis not present

## 2017-03-14 DIAGNOSIS — N390 Urinary tract infection, site not specified: Secondary | ICD-10-CM | POA: Diagnosis not present

## 2017-08-06 DIAGNOSIS — Z302 Encounter for sterilization: Secondary | ICD-10-CM | POA: Diagnosis not present

## 2017-10-06 NOTE — Progress Notes (Signed)
[facility] at [facility] [address] [place], [place] [address] [phone]  Date:  10/07/2017   Name:  Carl Rivera   DOB:  31-Jan-1981   MRN:  [id number]  PCP:  Darreld Mclean, MD    Chief Complaint: Testicle Pain (left pain)   History of Present Illness:  Carl Rivera is a 37 y.o. very pleasant male patient who presents with the following:  Generally healthy young man here today with concern about testicular pain and a few other concerns.  He saw urology and had a vasectomy in January I last saw him in October of last year:  Here today as a new patient to establish care, but is not new to town.  He was not happy with his last primary care provider and wants to try our office He had a lipoma removed from his abd wall a few years ago- benign There is a family history of colon cancer, "big time," but they are not sure if there is a genetic component as testing has not been done. However it does sound like they have a genetic predisposition to colon cancer given the history  He has been doing colonoscopy every 3 years,  His dad has pre-cancerous polyps, brother (younger) has had several pre-cancerous polyps. A cousin had colon cancer at 79 yo, and 2 of his 3 dad's brothers have had colon cancer in their 8s as well.  He is an Forensic psychologist,  Nurse, adult, federal indigent defense work.  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 He had some blood drawn but does not know if he will be seeing these results so he is not opposed to getting  labs today if needed  He started to work out this past April- doing several days a week- he notes that he seems to be getting in better shape although he has not lost a lot of lbs. However he overall feels fitter and stronger than he had   Over July 4th weekend he decided to go a month without drinking alcohol.  Around July 10th he noted left testicular pain- enough to impede his day to day movements. For a week it was pretty painful.  This pain is better now, but is still present.  Over the last 2 weeks it is less severe but still noticeable No urinary sx He also has occasionally noted that he felt really tired, and he also had one episode of vomiting about a week ago- this resolved. No belly pain or bad testicle pain at the time that he was vomiting.  He is not sure what could have caused this.  No known exposure to any sick contacts of suspicious foods  He has noted some mood swings the last 2 weeks- like he will tear up easily and get emotional   Also on 7/23 he had some blood drawn for his life insurance-  he felt strange for about 6 hours  afterwords and had what sounds like a visual aura.  He noted a small spot in his vision that was not clear, it was present in both eyes.  Finally did go away.  He did not have any other neurological symptoms at that time or since  He did have a migraine many years ago but he does not typically have these   He did have a vasectomy done in January- his operation took longer than normal, he took a bit longer to recover He went back to have his clearance testing and was ok  His kids are now 7,4,2, and 6 months The baby is doing well and has a super temperment  He is pretty active and is involved in a lot of things.  Does not have much time to relax. He is getting 6-7 hours a sleep at night and thinks that he is getting enough rest for him generally   He is not sure about any family history of prostate cancer or testicular cancer   Patient Active Problem  List   Diagnosis Date Noted  . Overweight 12/24/2016  . Benign neoplasm of colon 08/05/2013  . Family history of malignant neoplasm of gastrointestinal tract 08/05/2013    Past Medical History:  Diagnosis Date  . Family history of colon cancer   . Localized superficial swelling, mass, or lump     Past Surgical History:  Procedure Laterality Date  . BUNIONECTOMY  2013  . LIPOMA EXCISION     x2    Social History   Tobacco Use  . Smoking status: Never Smoker  . Smokeless tobacco: Never Used  Substance Use Topics  . Alcohol use: Yes    Alcohol/week: 0.6 - 1.2 oz    Types: 1 - 2 Glasses of wine per week  . Drug use: Never    Family History  Problem Relation Age of Onset  . Familial polyposis Father        multiple polyps (72-60) ; unknown pathology; no cancer  . Cancer Paternal Grandmother 28       colon cancer; breast cancer in 74s  . Cancer Paternal Uncle 79       colorectal cancer  . Familial polyposis Paternal Uncle 10       polyp history; unknown type or number  . Cancer Other 69       1st cousin once removed (pat grandmother's sister's son) with colorectal cancer    No Known Allergies  Medication list has been reviewed and updated.  No current outpatient medications on file prior to visit.   No current facility-administered medications on file prior to visit.     Review of Systems:  As per HPI- otherwise negative. No chest pain or any abnormal SOB Her feels good with exercise He is not having any unusual headaches   Physical Examination: Vitals:   10/07/17 1156  BP: 122/86  Pulse: 68  Resp: 16  Temp: 97.8 F (36.6 C)  SpO2: 97%   Vitals:   10/07/17 1156  Weight: 236 lb 12.8 oz (107.4 kg)  Height: 6\' 3"  (1.905 m)   Body mass index is 29.6 kg/m. Ideal Body Weight: Weight in (lb) to have BMI = 25: 199.6  GEN: WDWN, NAD, Non-toxic, A & O x 3, looks well, tall build  HEENT: Atraumatic, Normocephalic. Neck supple. No masses, No LAD.   Bilateral TM wnl, oropharynx normal.  PEERL,EOMI.   Ears and Nose: No external deformity. CV: RRR, No M/G/R. No JVD. No thrill.  No extra heart sounds. PULM: CTA B, no wheezes, crackles, rhonchi. No retractions. No resp. distress. No accessory muscle use. ABD: S, NT, ND, +BS. No rebound. No HSM. EXTR: No c/c/e NEURO Normal gait.  Normal movement of all limbs PSYCH: Normally interactive. Conversant. Not depressed or anxious appearing.  Calm demeanor.  Normal testicles and penis- no testicular masses or other abnl noted, no hernia He notes that the posterior aspect of the left testicle is slightly tender to palpation  Wt Readings from Last 3 Encounters:  10/07/17 236 lb 12.8 oz (107.4 kg)  12/24/16 237 lb 6.4 oz (107.7 kg)  09/12/13 230 lb (104.3 kg)    Assessment and Plan: Testicle pain  Feeling tired - Plan: CBC, Comprehensive metabolic panel, TSH, Sedimentation rate, C-reactive protein  Non-intractable vomiting with nausea, unspecified vomiting type - Plan: Comprehensive metabolic panel  Aura - Plan: US Carotid Duplex Bilateral  Here today with a few different concerns. He had noted testicular pain which has gotten a lot better but not resolved. Encouraged him to follow-up with his urologist about this asap He also has complaints of sometimes feeling tired, of an episode of vomiting, and feeling more emotional, and of a aura lasting several hours.  The vomiting and aura were self limited  It is hard to know if these sx might all be related.  Will obtain labs as above and will obtain carotid US for his aura.  Discussed getting an MRI of his head but he declines for now He will let me know if any changes or worsening in his symptoms while we start his w/u  Signed Lamar Blinks, MD  Received his labs  Results for orders placed or performed in visit on 10/07/17  CBC  Result Value Ref Range   WBC 4.5 4.0 - 10.5 K/uL   RBC 5.09 4.22 - 5.81 Mil/uL   Platelets 140.0 (L) 150.0 -  400.0 K/uL   Hemoglobin 15.2 13.0 - 17.0 g/dL   HCT 43.9 39.0 - 52.0 %   MCV 86.2 78.0 - 100.0 fl   MCHC 34.6 30.0 - 36.0 g/dL   RDW 13.6 11.5 - 15.5 %  Comprehensive metabolic panel  Result Value Ref Range   Sodium 140 135 - 145 mEq/L   Potassium 3.8 3.5 - 5.1 mEq/L   Chloride 103 96 - 112 mEq/L   CO2 31 19 - 32 mEq/L   Glucose, Bld 122 (H) 70 - 99 mg/dL   BUN 14 6 - 23 mg/dL   Creatinine, Ser 0.96 0.40 - 1.50 mg/dL   Total Bilirubin 0.9 0.2 - 1.2 mg/dL   Alkaline Phosphatase 52 39 - 117 U/L   AST 17 0 - 37 U/L   ALT 28 0 - 53 U/L   Total Protein 7.0 6.0 - 8.3 g/dL   Albumin 4.6 3.5 - 5.2 g/dL   Calcium 9.7 8.4 - 10.5 mg/dL   GFR 94.24 >60.00 mL/min  TSH  Result Value Ref Range   TSH 1.03 0.35 - 4.50 uIU/mL  Sedimentation rate  Result Value Ref Range   Sed Rate 1 0 - 15 mm/hr  C-reactive protein  Result Value Ref Range   CRP 0.1 (L) 0.5 - 20.0 mg/dL

## 2017-10-07 ENCOUNTER — Encounter: Payer: Self-pay | Admitting: Family Medicine

## 2017-10-07 ENCOUNTER — Ambulatory Visit: Payer: Federal, State, Local not specified - PPO | Admitting: Family Medicine

## 2017-10-07 VITALS — BP 122/86 | HR 68 | Temp 97.8°F | Resp 16 | Ht 75.0 in | Wt 236.8 lb

## 2017-10-07 DIAGNOSIS — N50819 Testicular pain, unspecified: Secondary | ICD-10-CM | POA: Diagnosis not present

## 2017-10-07 DIAGNOSIS — R29818 Other symptoms and signs involving the nervous system: Secondary | ICD-10-CM | POA: Diagnosis not present

## 2017-10-07 DIAGNOSIS — R112 Nausea with vomiting, unspecified: Secondary | ICD-10-CM | POA: Diagnosis not present

## 2017-10-07 DIAGNOSIS — R5383 Other fatigue: Secondary | ICD-10-CM | POA: Diagnosis not present

## 2017-10-07 LAB — TSH: TSH: 1.03 u[IU]/mL (ref 0.35–4.50)

## 2017-10-07 LAB — COMPREHENSIVE METABOLIC PANEL
ALK PHOS: 52 U/L (ref 39–117)
ALT: 28 U/L (ref 0–53)
AST: 17 U/L (ref 0–37)
Albumin: 4.6 g/dL (ref 3.5–5.2)
BUN: 14 mg/dL (ref 6–23)
CHLORIDE: 103 meq/L (ref 96–112)
CO2: 31 mEq/L (ref 19–32)
Calcium: 9.7 mg/dL (ref 8.4–10.5)
Creatinine, Ser: 0.96 mg/dL (ref 0.40–1.50)
GFR: 94.24 mL/min (ref >60.00)
GLUCOSE: 122 mg/dL — AB (ref 70–99)
POTASSIUM: 3.8 meq/L (ref 3.5–5.1)
Sodium: 140 mEq/L (ref 135–145)
Total Bilirubin: 0.9 mg/dL (ref 0.2–1.2)
Total Protein: 7 g/dL (ref 6.0–8.3)

## 2017-10-07 LAB — CBC
HEMATOCRIT: 43.9 % (ref 39.0–52.0)
HEMOGLOBIN: 15.2 g/dL (ref 13.0–17.0)
MCHC: 34.6 g/dL (ref 30.0–36.0)
MCV: 86.2 fl (ref 78.0–100.0)
Platelets: 140 10*3/uL — ABNORMAL LOW (ref 150.0–400.0)
RBC: 5.09 Mil/uL (ref 4.22–5.81)
RDW: 13.6 % (ref 11.5–15.5)
WBC: 4.5 10*3/uL (ref 4.0–10.5)

## 2017-10-07 LAB — C-REACTIVE PROTEIN: CRP: 0.1 mg/dL — ABNORMAL LOW (ref 0.5–20.0)

## 2017-10-07 LAB — SEDIMENTATION RATE: SED RATE: 1 mm/h (ref 0–15)

## 2017-10-07 NOTE — Patient Instructions (Addendum)
Good to see you today- congrats on your new baby!   Please give Dr Juel Burrow office a call and ask him to check your testicular pain.  You may have some post- vasectomy discomfort or perhaps another issue.  However I certainly do not feel any concerning mass or bump   We will get some labs for you today and I will be in touch with your reports asap Please do alert me right away if you have any more episodes such as you described after your blood draw last month

## 2017-10-08 ENCOUNTER — Ambulatory Visit (HOSPITAL_BASED_OUTPATIENT_CLINIC_OR_DEPARTMENT_OTHER)
Admission: RE | Admit: 2017-10-08 | Discharge: 2017-10-08 | Disposition: A | Discharge status: Home / Self Care | Payer: Federal, State, Local not specified - PPO | Source: Ambulatory Visit | Attending: Family Medicine | Admitting: Family Medicine

## 2017-10-08 DIAGNOSIS — H539 Unspecified visual disturbance: Secondary | ICD-10-CM | POA: Diagnosis not present

## 2017-10-08 DIAGNOSIS — R29818 Other symptoms and signs involving the nervous system: Secondary | ICD-10-CM

## 2017-10-09 ENCOUNTER — Encounter: Payer: Self-pay | Admitting: Family Medicine

## 2017-11-05 DIAGNOSIS — N509 Disorder of male genital organs, unspecified: Secondary | ICD-10-CM | POA: Diagnosis not present

## 2017-11-05 DIAGNOSIS — N451 Epididymitis: Secondary | ICD-10-CM | POA: Diagnosis not present

## 2017-11-20 DIAGNOSIS — Z23 Encounter for immunization: Secondary | ICD-10-CM | POA: Diagnosis not present

## 2017-12-29 NOTE — Progress Notes (Addendum)
[place] [facility] [address] [place], [place] [address] [phone]  Date:  12/30/2017   Name:  Carl Rivera   DOB:  Sep 11, 1980   MRN:  [id number]  PCP:  Darreld Mclean, MD    Chief Complaint: Annual Exam   History of Present Illness:  Carl Rivera is a 37 y.o. very pleasant male patient who presents with the following:  Here today for a CPE History of overweight, borderline A1c. Last seen here in August with a few concerns: Here today with a few different concerns. He had noted testicular pain which has gotten a lot better but not resolved. Encouraged him to follow-up with his urologist about this asap He also has complaints of sometimes feeling tired, of an episode of vomiting, and feeling more emotional, and of a aura lasting several hours.  The vomiting and aura were self limited  It is hard to know if these sx might all be related.  Will obtain labs as above and will obtain carotid US for his aura.  Discussed getting an MRI of his head but he declines for now He will let me know if any changes or worsening in his symptoms while we start his w/u  We got labs at that time- all looked ok, and also a carotid US which was ok  Strong family history of colon cancer  Flu: done already Tetanus: boost today Labs:  UTD except for lipids, he prefers to put this off today  His last colon was 4 years ago- he is due for screening due to family history of colon cancer.  He would like to change to a different GI doc which I will do for him today   There is a family history of colon cancer, "big time," but they are not sure if there is a genetic componentas testing has not been done. However it does sound like they have a genetic predisposition to colon cancer given the history He has been doing colonoscopy every 3 years,  His dad has pre-cancerous polyps, brother (younger) has had several pre-cancerous polyps. A cousin had colon  cancer at 79 yo, and 2 of his 3 dad's brothers have had colon cancerin their 48sas well.  He is an Forensic psychologist, Nurse, adult, federal indigent defense work.  He is married to Carl Rivera, who is now a stay at [employer] (used to a be a Radio producer)  4 children including the baby who arrived earlier this year   He did see his urologist over the summer for an episode of epididymitis. He actually thinks this is what caused all of his sx as above, as treating the infection cleared up everything else.  He is feeling fine now [place] farm- he is doing some work outdoors in addition to doing his jaw practice   He is exercising 4 days a week or so- feels like he is getting in better shape and clothes are fitting better He is quite active on their farm Has lost a few lbs  No CP or SOB  He has noted urinary frequency at night only the last week or so- will check on his urine today   BP Readings from Last 3 Encounters:  12/30/17 128/78  10/07/17 122/86  12/24/16 128/88   Wt Readings from Last 3 Encounters:  12/30/17 232 lb (105.2 kg)  10/07/17 236 lb 12.8 oz (107.4 kg)  12/24/16 237 lb 6.4 oz (107.7  kg)    Patient Active Problem List   Diagnosis Date Noted  . Overweight 12/24/2016  . Benign neoplasm of colon 08/05/2013  . Family history of malignant neoplasm of gastrointestinal tract 08/05/2013    Past Medical History:  Diagnosis Date  . Family history of colon cancer   . Localized superficial swelling, mass, or lump     Past Surgical History:  Procedure Laterality Date  . BUNIONECTOMY  2013  . LIPOMA EXCISION     x2    Social History   Tobacco Use  . Smoking status: Never Smoker  . Smokeless tobacco: Never Used  Substance Use Topics  . Alcohol use: Yes    Alcohol/week: 1.0 - 2.0 standard drinks    Types: 1 - 2 Glasses of wine per week  . Drug use: Never    Family History  Problem Relation Age of Onset  . Familial polyposis Father         multiple polyps (69-60) ; unknown pathology; no cancer  . Cancer Paternal Grandmother 36       colon cancer; breast cancer in 38s  . Cancer Paternal Uncle 66       colorectal cancer  . Familial polyposis Paternal Uncle 64       polyp history; unknown type or number  . Cancer Other 30       1st cousin once removed (pat grandmother's sister's son) with colorectal cancer    No Known Allergies  Medication list has been reviewed and updated.  No current outpatient medications on file prior to visit.   No current facility-administered medications on file prior to visit.     Review of Systems:  As per HPI- otherwise negative. No CP or SOB with exercise He has multiple cherry angiomas on his skin but these are longstanding   Physical Examination: Vitals:   12/30/17 0820  BP: 128/78  Pulse: 66  Resp: 16  Temp: 97.7 F (36.5 C)  SpO2: 98%   Vitals:   12/30/17 0820  Weight: 232 lb (105.2 kg)  Height: 6\' 3"  (1.905 m)   Body mass index is 29 kg/m. Ideal Body Weight: Weight in (lb) to have BMI = 25: 199.6  GEN: WDWN, NAD, Non-toxic, A & O x 3,looks well, tall build  HEENT: Atraumatic, Normocephalic. Neck supple. No masses, No LAD.  Bilateral TM wnl, oropharynx normal.  PEERL,EOMI.   Ears and Nose: No external deformity. CV: RRR, No M/G/R. No JVD. No thrill. No extra heart sounds. PULM: CTA B, no wheezes, crackles, rhonchi. No retractions. No resp. distress. No accessory muscle use. ABD: S, NT, ND, +BS. No rebound. No HSM. EXTR: No c/c/e NEURO Normal gait.  PSYCH: Normally interactive. Conversant. Not depressed or anxious appearing.  Calm demeanor.  Several cherry angiomas noted on trunk  Results for orders placed or performed in visit on 12/30/17  POCT urinalysis dipstick  Result Value Ref Range   Color, UA yellow yellow   Clarity, UA clear clear   Glucose, UA negative negative mg/dL   Bilirubin, UA negative negative   Ketones, POC UA negative negative mg/dL   Spec  Grav, UA 1.020 1.010 - 1.025   Blood, UA negative negative   pH, UA 6.0 5.0 - 8.0   Protein Ur, POC negative negative mg/dL   Urobilinogen, UA 0.2 0.2 or 1.0 E.U./dL   Nitrite, UA Negative Negative   Leukocytes, UA Negative Negative     Assessment and Plan: Physical exam  Family history of malignant  neoplasm of gastrointestinal tract - Plan: Ambulatory referral to Gastroenterology  Screening for hyperlipidemia  Nocturia - Plan: POCT urinalysis dipstick, Urine Culture  Immunization due - Plan: Tdap vaccine greater than or equal to 7yo IM  CPE today Prefers not to do labs today- we did a CMP and CBC in August which was ok  Referral to [place] given today as he is due Flu shot done already  Urine culture pending  With urine culture will offer to do a set of labs, lipids and A1c in 6 months if he likes as a lab visit only   Signed Lamar Blinks, MD  Received urine culture 10/29 Results for orders placed or performed in visit on 12/30/17  Urine Culture  Result Value Ref Range   MICRO NUMBER: [id number]    SPECIMEN QUALITY: ADEQUATE    Sample Source NOT GIVEN    STATUS: FINAL    Result: No Growth   POCT urinalysis dipstick  Result Value Ref Range   Color, UA yellow yellow   Clarity, UA clear clear   Glucose, UA negative negative mg/dL   Bilirubin, UA negative negative   Ketones, POC UA negative negative mg/dL   Spec Grav, UA 1.020 1.010 - 1.025   Blood, UA negative negative   pH, UA 6.0 5.0 - 8.0   Protein Ur, POC negative negative mg/dL   Urobilinogen, UA 0.2 0.2 or 1.0 E.U./dL   Nitrite, UA Negative Negative   Leukocytes, UA Negative Negative   Message to pt   Your urine is quite clear and your urine culture is negative for any infection.  Please let me know if you continue to have any urinary concerns  I ordered a lipid panel and A1c (average blood sugar) for you-  let's have you come in for a lab visit only (fasting) in about 6 months to see how you  are doing with your increased activity/ exercise.

## 2017-12-30 ENCOUNTER — Encounter: Payer: Self-pay | Admitting: Family Medicine

## 2017-12-30 ENCOUNTER — Ambulatory Visit (INDEPENDENT_AMBULATORY_CARE_PROVIDER_SITE_OTHER): Payer: Federal, State, Local not specified - PPO | Admitting: Family Medicine

## 2017-12-30 VITALS — BP 128/78 | HR 66 | Temp 97.7°F | Resp 16 | Ht 75.0 in | Wt 232.0 lb

## 2017-12-30 DIAGNOSIS — Z0000 Encounter for general adult medical examination without abnormal findings: Secondary | ICD-10-CM | POA: Diagnosis not present

## 2017-12-30 DIAGNOSIS — Z800 Family history of malignant neoplasm of digestive organs: Secondary | ICD-10-CM | POA: Diagnosis not present

## 2017-12-30 DIAGNOSIS — Z23 Encounter for immunization: Secondary | ICD-10-CM | POA: Diagnosis not present

## 2017-12-30 DIAGNOSIS — Z13220 Encounter for screening for lipoid disorders: Secondary | ICD-10-CM | POA: Diagnosis not present

## 2017-12-30 DIAGNOSIS — R351 Nocturia: Secondary | ICD-10-CM | POA: Diagnosis not present

## 2017-12-30 DIAGNOSIS — Z131 Encounter for screening for diabetes mellitus: Secondary | ICD-10-CM

## 2017-12-30 LAB — POCT URINALYSIS DIP (MANUAL ENTRY)
BILIRUBIN UA: NEGATIVE mg/dL
Bilirubin, UA: NEGATIVE
Blood, UA: NEGATIVE
Glucose, UA: NEGATIVE mg/dL
LEUKOCYTES UA: NEGATIVE
Nitrite, UA: NEGATIVE
PROTEIN UA: NEGATIVE mg/dL
SPEC GRAV UA: 1.02 (ref 1.010–1.025)
UROBILINOGEN UA: 0.2 U/dL
pH, UA: 6 (ref 5.0–8.0)

## 2017-12-30 NOTE — Patient Instructions (Signed)
Good to see you today as always Take care, I will be in touch with your urine culture Continue to be physically active and try to eat a healthy diet most of the time  We will refer you to a gastroenterologist with [facility] for your periodic colonoscopy let me know if you don't hear about this appt in the next week or so   Health Maintenance, Male A healthy lifestyle and preventive care is important for your health and wellness. Ask your health care provider about what schedule of regular examinations is right for you. What should I know about weight and diet? Eat a Healthy Diet  Eat plenty of vegetables, fruits, whole grains, low-fat dairy products, and lean protein.  Do not eat a lot of foods high in solid fats, added sugars, or salt.  Maintain a Healthy Weight Regular exercise can help you achieve or maintain a healthy weight. You should:  Do at least 150 minutes of exercise each week. The exercise should increase your heart rate and make you sweat (moderate-intensity exercise).  Do strength-training exercises at least twice a week.  Watch Your Levels of Cholesterol and Blood Lipids  Have your blood tested for lipids and cholesterol every 5 years starting at 36 years of age. If you are at high risk for heart disease, you should start having your blood tested when you are 36 years old. You may need to have your cholesterol levels checked more often if: ? Your lipid or cholesterol levels are high. ? You are older than 36 years of age. ? You are at high risk for heart disease.  What should I know about cancer screening? Many types of cancers can be detected early and may often be prevented. Lung Cancer  You should be screened every year for lung cancer if: ? You are a current smoker who has smoked for at least 30 years. ? You are a former smoker who has quit within the past 15 years.  Talk to your health care provider about your screening options, when you should start screening,  and how often you should be screened.  Colorectal Cancer  Routine colorectal cancer screening usually begins at 36 years of age and should be repeated every 5-10 years until you are 36 years old. You may need to be screened more often if early forms of precancerous polyps or small growths are found. Your health care provider may recommend screening at an earlier age if you have risk factors for colon cancer.  Your health care provider may recommend using home test kits to check for hidden blood in the stool.  A small camera at the end of a tube can be used to examine your colon (sigmoidoscopy or colonoscopy). This checks for the earliest forms of colorectal cancer.  Prostate and Testicular Cancer  Depending on your age and overall health, your health care provider may do certain tests to screen for prostate and testicular cancer.  Talk to your health care provider about any symptoms or concerns you have about testicular or prostate cancer.  Skin Cancer  Check your skin from head to toe regularly.  Tell your health care provider about any new moles or changes in moles, especially if: ? There is a change in a mole's size, shape, or color. ? You have a mole that is larger than a pencil eraser.  Always use sunscreen. Apply sunscreen liberally and repeat throughout the day.  Protect yourself by wearing long sleeves, pants, a wide-brimmed hat, and sunglasses  when outside.  What should I know about heart disease, diabetes, and high blood pressure?  If you are 16-79 years of age, have your blood pressure checked every 3-5 years. If you are 59 years of age or older, have your blood pressure checked every year. You should have your blood pressure measured twice-once when you are at a hospital or clinic, and once when you are not at a hospital or clinic. Record the average of the two measurements. To check your blood pressure when you are not at a hospital or clinic, you can use: ? An automated  blood pressure machine at a pharmacy. ? A home blood pressure monitor.  Talk to your health care provider about your target blood pressure.  If you are between 87-30 years old, ask your health care provider if you should take aspirin to prevent heart disease.  Have regular diabetes screenings by checking your fasting blood sugar level. ? If you are at a normal weight and have a low risk for diabetes, have this test once every three years after the age of 50. ? If you are overweight and have a high risk for diabetes, consider being tested at a younger age or more often.  A one-time screening for abdominal aortic aneurysm (AAA) by ultrasound is recommended for men aged 58-75 years who are current or former smokers. What should I know about preventing infection? Hepatitis B If you have a higher risk for hepatitis B, you should be screened for this virus. Talk with your health care provider to find out if you are at risk for hepatitis B infection. Hepatitis C Blood testing is recommended for:  Everyone born from [birth year] through 1965.  Anyone with known risk factors for hepatitis C.  Sexually Transmitted Diseases (STDs)  You should be screened each year for STDs including gonorrhea and chlamydia if: ? You are sexually active and are younger than 36 years of age. ? You are older than 36 years of age and your health care provider tells you that you are at risk for this type of infection. ? Your sexual activity has changed since you were last screened and you are at an increased risk for chlamydia or gonorrhea. Ask your health care provider if you are at risk.  Talk with your health care provider about whether you are at high risk of being infected with HIV. Your health care provider may recommend a prescription medicine to help prevent HIV infection.  What else can I do?  Schedule regular health, dental, and eye exams.  Stay current with your vaccines (immunizations).  Do not use any  tobacco products, such as cigarettes, chewing tobacco, and e-cigarettes. If you need help quitting, ask your health care provider.  Limit alcohol intake to no more than 2 drinks per day. One drink equals 12 ounces of beer, 5 ounces of wine, or 1 ounces of hard liquor.  Do not use street drugs.  Do not share needles.  Ask your health care provider for help if you need support or information about quitting drugs.  Tell your health care provider if you often feel depressed.  Tell your health care provider if you have ever been abused or do not feel safe at home. This information is not intended to replace advice given to you by your health care provider. Make sure you discuss any questions you have with your health care provider. Document Released: 08/18/2007 Document Revised: 10/19/2015 Document Reviewed: 11/23/2014 Elsevier Interactive Patient Education  2018 Elsevier  Inc.  

## 2017-12-31 ENCOUNTER — Encounter: Payer: Self-pay | Admitting: Family Medicine

## 2017-12-31 LAB — URINE CULTURE
MICRO NUMBER: 91293282
RESULT: NO GROWTH
SPECIMEN QUALITY: ADEQUATE

## 2017-12-31 NOTE — Addendum Note (Signed)
Addended by: Lamar Blinks C on: 12/31/2017 01:28 PM   Modules accepted: Orders

## 2018-01-07 ENCOUNTER — Telehealth: Payer: Self-pay | Admitting: Gastroenterology

## 2018-01-07 NOTE — Telephone Encounter (Signed)
RECEIVED LAST COLON RECORDS, PLEASE REVIEW RECORDS AND ADVISE IF PATIENT IS DUE FOR COLONOSCOPY DOD DR Tarri Glenn 12/30/2017 AM

## 2018-04-15 DIAGNOSIS — F4320 Adjustment disorder, unspecified: Secondary | ICD-10-CM | POA: Diagnosis not present

## 2018-04-22 ENCOUNTER — Ambulatory Visit: Payer: Self-pay

## 2018-04-22 NOTE — Progress Notes (Signed)
[place] [facility] [address] [place], [place] [address] [phone]  Date:  04/24/2018   Name:  Carl Rivera   DOB:  08-26-1980   MRN:  [id number]  PCP:  Darreld Mclean, MD    Chief Complaint: Abdominal Pain (lower right side pain, bloating, weight gain, raidating to back, stabbing, no nausea/vomiting)   History of Present Illness:  Carl Rivera is a 38 y.o. very pleasant male patient who presents with the following:  Generally healthy man, here today with concern of abdominal pain Of note there is a family history of colon cancer, and he is already undergone colonoscopy twice  Today is Thursday On Saturday he went out and had 2 beers, then ate chicken wings.  He ate too much The next am he felt a little bit ill, Sunday evening he noted a mild RLQ pain He also felt fatigued- like he was getting a cold Monday am he noted sinus congestion He continued to have RLQ pain, has been present the last several days and getting worse He called on Tuesday and got this appt Yesterday he was having pain all day- the belly pain seemed to be spreading  He continues to feel just mildly ill in general, tired.  He is not sure if just his cold or something else   No fever He is still able to eat - not quite like normal Ate lunch prior to coming in today  He has gained a few lbs the last week or so No nausea or vomiting He is still normal BM The pain is getting worse  No urinary sx   Wt Readings from Last 3 Encounters:  04/24/18 238 lb (108 kg)  12/30/17 232 lb (105.2 kg)  10/07/17 236 lb 12.8 oz (107.4 kg)        Patient Active Problem List   Diagnosis Date Noted  . Overweight 12/24/2016  . Benign neoplasm of colon 08/05/2013  . Family history of malignant neoplasm of gastrointestinal tract 08/05/2013    Past Medical History:  Diagnosis Date  . Family history of colon cancer   . Localized superficial swelling, mass, or  lump     Past Surgical History:  Procedure Laterality Date  . BUNIONECTOMY  2013  . LIPOMA EXCISION     x2    Social History   Tobacco Use  . Smoking status: Never Smoker  . Smokeless tobacco: Never Used  Substance Use Topics  . Alcohol use: Yes    Alcohol/week: 1.0 - 2.0 standard drinks    Types: 1 - 2 Glasses of wine per week  . Drug use: Never    Family History  Problem Relation Age of Onset  . Familial polyposis Father        multiple polyps (57-60) ; unknown pathology; no cancer  . Cancer Paternal Grandmother 58       colon cancer; breast cancer in 34s  . Cancer Paternal Uncle 41       colorectal cancer  . Familial polyposis Paternal Uncle 108       polyp history; unknown type or number  . Cancer Other 44       1st cousin once removed (pat grandmother's sister's son) with colorectal cancer    Allergies  Allergen Reactions  . Other     Tree Nut, scratchy throat    Medication list has been reviewed and updated.  No current outpatient medications on file prior  to visit.   No current facility-administered medications on file prior to visit.     Review of Systems:  As per HPI- otherwise negative.   Physical Examination: Vitals:   04/24/18 1331  BP: 134/80  Pulse: 74  Resp: 16  Temp: 98 F (36.7 C)  SpO2: 98%   Vitals:   04/24/18 1331  Weight: 238 lb (108 kg)  Height: 6\' 3"  (1.905 m)   Body mass index is 29.75 kg/m. Ideal Body Weight: Weight in (lb) to have BMI = 25: 199.6  GEN: WDWN, NAD, Non-toxic, A & O x 3, tall build, looks well  HEENT: Atraumatic, Normocephalic. Neck supple. No masses, No LAD.  Bilateral TM wnl, oropharynx normal.  PEERL,EOMI.   Ears and Nose: No external deformity. CV: RRR, No M/G/R. No JVD. No thrill. No extra heart sounds. PULM: CTA B, no wheezes, crackles, rhonchi. No retractions. No resp. distress. No accessory muscle use. ABD:  +BS. No rebound. No HSM. He has tenderness of the right abdomen, both upper and lower  quadrants.  He is not really able to pinpoint where the pain is coming from. He is guarding slightly  ?worst in lower quad EXTR: No c/c/e NEURO Normal gait.  PSYCH: Normally interactive. Conversant. Not depressed or anxious appearing.  Calm demeanor.  No testicular tenderness or inguinal hernia noted   Results for orders placed or performed in visit on 04/24/18  POCT Urinalysis Dipstick (Automated)  Result Value Ref Range   Color, UA yellow    Clarity, UA clear    Glucose, UA Negative Negative   Bilirubin, UA negative    Ketones, UA negative    Spec Grav, UA 1.020 1.010 - 1.025   Blood, UA negative    pH, UA 6.5 5.0 - 8.0   Protein, UA Positive (A) Negative   Urobilinogen, UA 0.2 0.2 or 1.0 E.U./dL   Nitrite, UA negative    Leukocytes, UA Negative Negative    Assessment and Plan: Right lower quadrant pain - Plan: POCT Urinalysis Dipstick (Automated)  Here today with RLQ pain/ more generalized abd pain  Possible appendicitis vs gallbladder colic Called ER and they are able to see pt- appreciate their care   Signed Lamar Blinks, MD

## 2018-04-22 NOTE — Telephone Encounter (Signed)
Incoming   Call from Patient  With  A  Complaint  Of  Lower  Right  Abdominal   Discomfort.  Onset  Was  Sunday.  Has  Progressed  To tender  To  Touch.  Patient  Also  Reports  That  At  The  Same  Time of  Pain  Onset,  He  Developed  A  Cold.  States  Discomfort  Is  always  There.   Rates  Pain  As  Moderated.  Patient  Reports  That  He  Eats  Really well Denies  Nausea and  Vomiting,  Diarrhea.  Patient  scheduled  For  Appointment  For Thursday 04/24/18 at  2:20Pm.   With Dr.  Lorelei Pont.  Patient  Voices  Understanding.     Carl Rivera Male, 38 y.o., 1980-10-27 MRN:  [id number] Phone:  [phone] [name]) PCP:  Darreld Mclean, MD Primary Cvg:  BLUE CROSS BLUE SHIELD/BCBS/FEDERAL EMP PPO Next Appt With Family Medicine 01/26/2019 at 8:30 AM Message from [facility] sent at 04/22/2018 10:53 AM EST   Summary: Call back    Patient is having discomfort-pain in his right lower side, he has had it for about 4 days  Best call back is [phone]        Call History    Type Contact  04/22/2018 10:51 AM Phone (Incoming) Carl Rivera (Self)  Phone: [phone] (H)  User: Windy Kalata  Encounter Report   Patient Encounter Report    Reason for Disposition . Abdominal pain is a chronic symptom (recurrent or ongoing AND present > 4 weeks)  Answer Assessment - Initial Assessment Questions 1. LOCATION: "Where does it hurt?"      *No Answer* 2. RADIATION: "Does the pain shoot anywhere else?" (e.g., chest, back)     No  tender 3. ONSET: "When did the pain begin?" (e.g., minutes, hours or days ago)        Sunday  Night  Progress  4. SUDDEN: "Gradual or sudden onset?"      gradual 5. PATTERN "Does the pain come and go, or is it constant?"    - If constant: "Is it getting better, staying the same, or worsening?"      (Note: Constant means the pain never goes away completely; most serious pain is constant and it progresses)     - If intermittent: "How long does it last?" "Do you  have pain now?"     (Note: Intermittent means the pain goes away completely between bouts)     Discomfort  Always  therer 6. SEVERITY: "How bad is the pain?"  (e.g., Scale 1-10; mild, moderate, or severe)    - MILD (1-3): doesn't interfere with normal activities, abdomen soft and not tender to touch     - MODERATE (4-7): interferes with normal activities or awakens from sleep, tender to touch     - SEVERE (8-10): excruciating pain, doubled over, unable to do any normal activities       moderate 7. RECURRENT SYMPTOM: "Have you ever had this type of abdominal pain before?" If so, ask: "When was the last time?" and "What happened that time?"      *No Answer* 8. AGGRAVATING FACTORS: "Does anything seem to cause this pain?" (e.g., foods, stress, alcohol)     eating  Really well,  Denies neasuea 9. CARDIAC SYMPTOMS: "Do you have any of the following symptoms: chest pain, difficulty breathing, sweating, nausea?"  denies 10. OTHER SYMPTOMS: "Do you have any other symptoms?" (e.g., fever, vomiting, diarrhea)       denies 11. PREGNANCY: "Is there any chance you are pregnant?" "When was your last menstrual period?"        na  Protocols used: ABDOMINAL PAIN - UPPER-A-AH

## 2018-04-24 ENCOUNTER — Emergency Department (HOSPITAL_BASED_OUTPATIENT_CLINIC_OR_DEPARTMENT_OTHER): Payer: Federal, State, Local not specified - PPO

## 2018-04-24 ENCOUNTER — Ambulatory Visit: Payer: Federal, State, Local not specified - PPO | Admitting: Family Medicine

## 2018-04-24 ENCOUNTER — Encounter: Payer: Self-pay | Admitting: Family Medicine

## 2018-04-24 ENCOUNTER — Emergency Department (HOSPITAL_BASED_OUTPATIENT_CLINIC_OR_DEPARTMENT_OTHER)
Admission: EM | Admit: 2018-04-24 | Discharge: 2018-04-24 | Disposition: A | Discharge status: Home / Self Care | Payer: Federal, State, Local not specified - PPO | Attending: Emergency Medicine | Admitting: Emergency Medicine

## 2018-04-24 ENCOUNTER — Other Ambulatory Visit: Payer: Self-pay

## 2018-04-24 ENCOUNTER — Encounter (HOSPITAL_BASED_OUTPATIENT_CLINIC_OR_DEPARTMENT_OTHER): Payer: Self-pay | Admitting: *Deleted

## 2018-04-24 VITALS — BP 134/80 | HR 74 | Temp 98.0°F | Resp 16 | Ht 75.0 in | Wt 238.0 lb

## 2018-04-24 DIAGNOSIS — R1031 Right lower quadrant pain: Secondary | ICD-10-CM

## 2018-04-24 DIAGNOSIS — R1011 Right upper quadrant pain: Secondary | ICD-10-CM | POA: Diagnosis not present

## 2018-04-24 DIAGNOSIS — R109 Unspecified abdominal pain: Secondary | ICD-10-CM

## 2018-04-24 LAB — COMPREHENSIVE METABOLIC PANEL
ALT: 27 U/L (ref 0–44)
ANION GAP: 6 (ref 5–15)
AST: 20 U/L (ref 15–41)
Albumin: 4.4 g/dL (ref 3.5–5.0)
Alkaline Phosphatase: 78 U/L (ref 38–126)
BUN: 12 mg/dL (ref 6–20)
CO2: 27 mmol/L (ref 22–32)
Calcium: 9.2 mg/dL (ref 8.9–10.3)
Chloride: 102 mmol/L (ref 98–111)
Creatinine, Ser: 0.92 mg/dL (ref 0.61–1.24)
GFR calc Af Amer: >60 mL/min (ref >60)
GFR calc non Af Amer: >60 mL/min (ref >60)
Glucose, Bld: 110 mg/dL — ABNORMAL HIGH (ref 70–99)
POTASSIUM: 3.7 mmol/L (ref 3.5–5.1)
Sodium: 135 mmol/L (ref 135–145)
Total Bilirubin: 0.7 mg/dL (ref 0.3–1.2)
Total Protein: 7.6 g/dL (ref 6.5–8.1)

## 2018-04-24 LAB — CBC
HCT: 46.9 % (ref 39.0–52.0)
HEMOGLOBIN: 15.4 g/dL (ref 13.0–17.0)
MCH: 28.9 pg (ref 26.0–34.0)
MCHC: 32.8 g/dL (ref 30.0–36.0)
MCV: 88.2 fL (ref 80.0–100.0)
NRBC: 0 % (ref 0.0–0.2)
Platelets: 163 10*3/uL (ref 150–400)
RBC: 5.32 MIL/uL (ref 4.22–5.81)
RDW: 12.1 % (ref 11.5–15.5)
WBC: 6.9 10*3/uL (ref 4.0–10.5)

## 2018-04-24 LAB — POC URINALSYSI DIPSTICK (AUTOMATED)
BILIRUBIN UA: NEGATIVE
Blood, UA: NEGATIVE
GLUCOSE UA: NEGATIVE
KETONES UA: NEGATIVE
LEUKOCYTES UA: NEGATIVE
NITRITE UA: NEGATIVE
Protein, UA: POSITIVE — AB
Spec Grav, UA: 1.02 (ref 1.010–1.025)
Urobilinogen, UA: 0.2 E.U./dL
pH, UA: 6.5 (ref 5.0–8.0)

## 2018-04-24 LAB — LIPASE, BLOOD: Lipase: 27 U/L (ref 11–51)

## 2018-04-24 MED ORDER — IOPAMIDOL (ISOVUE-300) INJECTION 61%
100.0000 mL | Freq: Once | INTRAVENOUS | Status: AC | PRN
  Administered 2018-04-24: 100 mL via INTRAVENOUS

## 2018-04-24 MED ORDER — SODIUM CHLORIDE 0.9 % IV BOLUS
1000.0000 mL | Freq: Once | INTRAVENOUS | Status: AC
  Administered 2018-04-24: 1000 mL via INTRAVENOUS

## 2018-04-24 MED ORDER — SODIUM CHLORIDE 0.9% FLUSH
3.0000 mL | Freq: Once | INTRAVENOUS | Status: DC
  Filled 2018-04-24: qty 3

## 2018-04-24 MED ORDER — ONDANSETRON HCL 4 MG/2ML IJ SOLN
4.0000 mg | Freq: Once | INTRAMUSCULAR | Status: DC
  Filled 2018-04-24: qty 2

## 2018-04-24 NOTE — ED Triage Notes (Signed)
Abdominal pain x 4 days. He was seen by his MD and told to come here to r/o gallstones or appendicitis.

## 2018-04-24 NOTE — ED Provider Notes (Signed)
[facility] Provider Note   CSN: [id number] Arrival date & time: 04/24/18  1352    History   Chief Complaint Chief Complaint  Patient presents with  . Abdominal Pain    HPI Carl Rivera is a 38 y.o. male.     The history is provided by the patient.  Abdominal Pain  Pain location:  RUQ and RLQ Pain quality: aching and dull   Pain radiates to:  Does not radiate Pain severity:  Moderate Onset quality:  Gradual Timing:  Intermittent Progression:  Worsening Chronicity:  New Context: not alcohol use, not eating, not sick contacts and not trauma   Relieved by:  Nothing Worsened by:  Nothing Associated symptoms: diarrhea and nausea   Associated symptoms: no chest pain, no chills, no cough, no dysuria, no fever, no hematuria, no shortness of breath, no sore throat and no vomiting     Past Medical History:  Diagnosis Date  . Family history of colon cancer   . Localized superficial swelling, mass, or lump     Patient Active Problem List   Diagnosis Date Noted  . Overweight 12/24/2016  . Benign neoplasm of colon 08/05/2013  . Family history of malignant neoplasm of gastrointestinal tract 08/05/2013    Past Surgical History:  Procedure Laterality Date  . BUNIONECTOMY  2013  . LIPOMA EXCISION     x2        Home Medications    Prior to Admission medications   Not on File    Family History Family History  Problem Relation Age of Onset  . Familial polyposis Father        multiple polyps (82-60) ; unknown pathology; no cancer  . Cancer Paternal Grandmother 61       colon cancer; breast cancer in 62s  . Cancer Paternal Uncle 56       colorectal cancer  . Familial polyposis Paternal Uncle 27       polyp history; unknown type or number  . Cancer Other 91       1st cousin once removed (pat grandmother's sister's son) with colorectal cancer    Social History Social History   Tobacco Use  . Smoking status: Never Smoker  .  Smokeless tobacco: Never Used  Substance Use Topics  . Alcohol use: Yes    Alcohol/week: 1.0 - 2.0 standard drinks    Types: 1 - 2 Glasses of wine per week  . Drug use: Never     Allergies   Other   Review of Systems Review of Systems  Constitutional: Negative for chills and fever.  HENT: Negative for ear pain and sore throat.   Eyes: Negative for pain and visual disturbance.  Respiratory: Negative for cough and shortness of breath.   Cardiovascular: Negative for chest pain and palpitations.  Gastrointestinal: Positive for abdominal pain, diarrhea and nausea. Negative for vomiting.  Genitourinary: Negative for dysuria and hematuria.  Musculoskeletal: Negative for arthralgias and back pain.  Skin: Negative for color change and rash.  Neurological: Negative for seizures and syncope.  All other systems reviewed and are negative.    Physical Exam Updated Vital Signs  ED Triage Vitals  Enc Vitals Group     BP 04/24/18 1402 127/70     Pulse Rate 04/24/18 1402 76     Resp 04/24/18 1402 16     Temp 04/24/18 1402 98.2 F (36.8 C)     Temp Source 04/24/18 1402 Oral     SpO2 04/24/18  1402 97 %     Weight 04/24/18 1359 237 lb 14 oz (107.9 kg)     Height 04/24/18 1359 6\' 3"  (1.905 m)     Head Circumference --      Peak Flow --      Pain Score 04/24/18 1359 5     Pain Loc --      Pain Edu? --      Excl. in [place]? --     Physical Exam Vitals signs and nursing note reviewed.  Constitutional:      Appearance: He is well-developed.  HENT:     Head: Normocephalic and atraumatic.  Eyes:     Extraocular Movements: Extraocular movements intact.     Conjunctiva/sclera: Conjunctivae normal.     Pupils: Pupils are equal, round, and reactive to light.  Neck:     Musculoskeletal: Neck supple.  Cardiovascular:     Rate and Rhythm: Normal rate and regular rhythm.     Heart sounds: Normal heart sounds. No murmur.  Pulmonary:     Effort: Pulmonary effort is normal. No respiratory  distress.     Breath sounds: Normal breath sounds.  Abdominal:     Palpations: Abdomen is soft.     Tenderness: There is abdominal tenderness in the right lower quadrant. There is no right CVA tenderness, left CVA tenderness, guarding or rebound. Negative signs include Murphy's sign and Rovsing's sign.  Skin:    General: Skin is warm and dry.     Capillary Refill: Capillary refill takes less than 2 seconds.  Neurological:     General: No focal deficit present.     Mental Status: He is alert.  Psychiatric:        Mood and Affect: Mood normal.      ED Treatments / Results  Labs (all labs ordered are listed, but only abnormal results are displayed) Labs Reviewed  COMPREHENSIVE METABOLIC PANEL - Abnormal; Notable for the following components:      Result Value   Glucose, Bld 110 (*)    All other components within normal limits  LIPASE, BLOOD  CBC    EKG None  Radiology Ct Abdomen Pelvis W Contrast  Result Date: 04/24/2018 CLINICAL DATA:  Acute right lower quadrant abdominal pain. EXAM: CT ABDOMEN AND PELVIS WITH CONTRAST TECHNIQUE: Multidetector CT imaging of the abdomen and pelvis was performed using the standard protocol following bolus administration of intravenous contrast. CONTRAST:  123mL ISOVUE-300 IOPAMIDOL (ISOVUE-300) INJECTION 61% COMPARISON:  None. FINDINGS: Lower chest: No acute abnormality. Hepatobiliary: No focal liver abnormality is seen. No gallstones, gallbladder wall thickening, or biliary dilatation. Pancreas: Unremarkable. No pancreatic ductal dilatation or surrounding inflammatory changes. Spleen: Normal in size without focal abnormality. Adrenals/Urinary Tract: Adrenal glands are unremarkable. Kidneys are normal, without renal calculi, focal lesion, or hydronephrosis. Bladder is unremarkable. Stomach/Bowel: Stomach is within normal limits. Appendix appears normal. No evidence of bowel wall thickening, distention, or inflammatory changes. Vascular/Lymphatic: No  significant vascular findings are present. No enlarged abdominal or pelvic lymph nodes. Reproductive: Prostate is unremarkable. Other: No abdominal wall hernia or abnormality. No abdominopelvic ascites. Musculoskeletal: No acute or significant osseous findings. IMPRESSION: No abnormality seen in the abdomen or pelvis. Electronically Signed   By: Marijo Conception, M.D.   On: 04/24/2018 16:00    Procedures Procedures (including critical care time)  Medications Ordered in ED Medications  sodium chloride flush (NS) 0.9 % injection 3 mL (3 mLs Intravenous Not Given 04/24/18 1429)  sodium chloride 0.9 % bolus  1,000 mL (1,000 mLs Intravenous New Bag/Given 04/24/18 1533)  ondansetron (ZOFRAN) injection 4 mg (4 mg Intravenous Refused 04/24/18 1526)  iopamidol (ISOVUE-300) 61 % injection 100 mL (100 mLs Intravenous Contrast Given 04/24/18 1536)     Initial Impression / Assessment and Plan / ED Course  I have reviewed the triage vital signs and the nursing notes.  Pertinent labs & imaging results that were available during my care of the patient were reviewed by me and considered in my medical decision making (see chart for details).     Bishoy Cupp is a 38 year old male with no significant medical history who presents to the ED with right-sided abdominal pain for the last several days.  Patient with unremarkable vitals.  No fever.  Patient sent from primary care office due to concern for possible appendicitis.  Possible gallbladder etiology.  Patient has had some loose stools.  Some nausea but no vomiting.  Is tender in the right lower quadrant mostly on exam.  No obvious Murphy sign or right upper quadrant tenderness on exam.  Lab work ready obtained showed no significant anemia, electrolyte abnormality, leukocytosis.  Had urinalysis done in clinic that was normal.  No history of kidney stones.  Will obtain a CT scan of the abdomen pelvis to rule out appendicitis and other intra-abdominal process.   Gallbladder and liver enzymes within normal limits.  Lipase normal and doubt pancreatitis.  Will give IV fluids, IV Zofran and reevaluate after CT scan.  CT scan of the abdomen and pelvis did not reveal any acute findings.  No gallstones.  No appendicitis.  No colitis.  Patient likely with viral process versus musculoskeletal origin of pain.  There were no kidney stones as well.  Recommend continued hydration at home with use of Tylenol/Motrin for pain.  Recommend follow-up with primary care doctor if pain persists.  Patient discharged in good condition.  This chart was dictated using voice recognition software.  Despite best efforts to proofread,  errors can occur which can change the documentation meaning.    Final Clinical Impressions(s) / ED Diagnoses   Final diagnoses:  Abdominal pain, unspecified abdominal location    ED Discharge Orders    None       Lennice Sites, DO 04/24/18 1623

## 2018-04-24 NOTE — ED Notes (Signed)
Patient transported to CT 

## 2018-04-24 NOTE — Discharge Instructions (Addendum)
Continue Tylenol Motrin for pain.  Follow-up with your primary care doctor symptoms persist.

## 2018-04-25 DIAGNOSIS — F4320 Adjustment disorder, unspecified: Secondary | ICD-10-CM | POA: Diagnosis not present

## 2018-05-06 ENCOUNTER — Encounter: Payer: Self-pay | Admitting: Gastroenterology

## 2018-05-09 ENCOUNTER — Ambulatory Visit (AMBULATORY_SURGERY_CENTER): Payer: Self-pay

## 2018-05-09 VITALS — Ht 76.0 in | Wt 241.0 lb

## 2018-05-09 DIAGNOSIS — Z800 Family history of malignant neoplasm of digestive organs: Secondary | ICD-10-CM

## 2018-05-09 DIAGNOSIS — F4320 Adjustment disorder, unspecified: Secondary | ICD-10-CM | POA: Diagnosis not present

## 2018-05-09 DIAGNOSIS — Z8371 Family history of colonic polyps: Secondary | ICD-10-CM

## 2018-05-09 DIAGNOSIS — Z86010 Personal history of colonic polyps: Secondary | ICD-10-CM

## 2018-05-09 MED ORDER — NA SULFATE-K SULFATE-MG SULF 17.5-3.13-1.6 GM/177ML PO SOLN: 1.0000 | Freq: Once | ORAL | 0 refills | Status: AC

## 2018-05-09 NOTE — Progress Notes (Signed)
Per pt, no allergies to soy or egg products.Pt not taking any weight loss meds or using  O2 at home.  Pt refused emmi video. 

## 2018-06-03 ENCOUNTER — Encounter: Payer: Self-pay | Admitting: Gastroenterology

## 2018-06-17 ENCOUNTER — Encounter: Payer: Federal, State, Local not specified - PPO | Admitting: Gastroenterology

## 2018-07-15 ENCOUNTER — Emergency Department (HOSPITAL_COMMUNITY): Payer: Federal, State, Local not specified - PPO

## 2018-07-15 ENCOUNTER — Encounter (HOSPITAL_COMMUNITY): Payer: Self-pay | Admitting: Emergency Medicine

## 2018-07-15 ENCOUNTER — Encounter: Payer: Self-pay | Admitting: Family Medicine

## 2018-07-15 ENCOUNTER — Emergency Department (HOSPITAL_COMMUNITY)
Admission: EM | Admit: 2018-07-15 | Discharge: 2018-07-15 | Disposition: A | Discharge status: Home / Self Care | Payer: Federal, State, Local not specified - PPO | Attending: Emergency Medicine | Admitting: Emergency Medicine

## 2018-07-15 ENCOUNTER — Other Ambulatory Visit: Payer: Self-pay

## 2018-07-15 DIAGNOSIS — Y999 Unspecified external cause status: Secondary | ICD-10-CM | POA: Insufficient documentation

## 2018-07-15 DIAGNOSIS — X500XXA Overexertion from strenuous movement or load, initial encounter: Secondary | ICD-10-CM | POA: Insufficient documentation

## 2018-07-15 DIAGNOSIS — Y929 Unspecified place or not applicable: Secondary | ICD-10-CM | POA: Insufficient documentation

## 2018-07-15 DIAGNOSIS — Y939 Activity, unspecified: Secondary | ICD-10-CM | POA: Diagnosis not present

## 2018-07-15 DIAGNOSIS — S8992XA Unspecified injury of left lower leg, initial encounter: Secondary | ICD-10-CM | POA: Diagnosis not present

## 2018-07-15 DIAGNOSIS — S82302A Unspecified fracture of lower end of left tibia, initial encounter for closed fracture: Secondary | ICD-10-CM | POA: Insufficient documentation

## 2018-07-15 DIAGNOSIS — S82392A Other fracture of lower end of left tibia, initial encounter for closed fracture: Secondary | ICD-10-CM | POA: Diagnosis not present

## 2018-07-15 MED ORDER — HYDROCODONE-ACETAMINOPHEN 5-325 MG PO TABS: 1.0000 | ORAL_TABLET | Freq: Four times a day (QID) | ORAL | 0 refills | Status: DC | PRN

## 2018-07-15 NOTE — ED Notes (Signed)
Ortho Tech paged.

## 2018-07-15 NOTE — ED Triage Notes (Signed)
Reports jumping down about three feet when boot got hung and his body landed on the left ankle.  C/o left ankle pain.

## 2018-07-15 NOTE — Discharge Instructions (Addendum)
You have been diagnosed today with nondisplaced left distal tibial fracture.  At this time there does not appear to be the presence of an emergent medical condition, however there is always the potential for conditions to change. Please read and follow the below instructions.  Please return to the Emergency Department immediately for any new or worsening symptoms. Please be sure to follow up with your Primary Care Provider within one week regarding your visit today; please call their office to schedule an appointment even if you are feeling better for a follow-up visit. Please call the orthopedic specialist Dr. Stann Mainland office tomorrow to schedule a follow-up appointment regarding your left ankle fracture.  Please remain nonweightbearing on your left ankle to avoid further injury.  Use the crutches provided today as instructed.  Use rest ice and elevation to help with pain and swelling.  You may use the pain medication Norco as prescribed to help with severe pain, do not drive or operate heavy machinery while taking Norco as will make you drowsy.  Do not take other sedating medications or drink alcohol with Norco as this will worsen side effects.  Get help right away if: Your foot or toes feel cold or turn blue, even after loosening your splint or brace. You have severe pain. Get help right away if: Your pain is getting worse. The injured area tingles, becomes numb, or turns cold and blue. The part of your body above or below the cast is swollen and discolored. You cannot feel or move your fingers or toes. There is fluid leaking through the cast. You have severe pain or pressure under the cast. You have trouble breathing. You have shortness of breath. You have chest pain. You have any new/concerning or worsening symptoms  Please read the additional information packets attached to your discharge summary.  Do not take your medicine if  develop an itchy rash, swelling in your mouth or lips, or  difficulty breathing.

## 2018-07-15 NOTE — ED Notes (Signed)
Patient verbalizes understanding of discharge instructions. Opportunity for questioning and answers were provided. Armband removed by staff, pt discharged from ED. Pt wheeled to lobby. 

## 2018-07-15 NOTE — ED Notes (Signed)
Ortho at bedside.

## 2018-07-15 NOTE — ED Provider Notes (Signed)
[facility] Provider Note   CSN: [id number] Arrival date & time: 07/15/18  1949    History   Chief Complaint Chief Complaint  Patient presents with  . Ankle Pain    HPI Carl Rivera is a 38 y.o. male presenting today with left ankle pain and swelling.  Patient reports that approximately 6:30 PM today he jumped off of a 3 foot ledge landing in soft sand.  Patient reports that his left ankle rolled underneath him during his fall.  He denies any head injury, loss of consciousness, blood thinner use, vision changes, nausea or vomiting.  Patient denies any neck or back pain, chest or abdominal pain, pain to the hips or pain to his other 3 extremities.  Patient endorses sudden onset left ankle pain described as a moderate intensity throbbing sensation constant primarily around the lateral malleolus worsened with ambulation and movement and slightly improved with elevation.     HPI  Past Medical History:  Diagnosis Date  . Family history of colon cancer   . Localized superficial swelling, mass, or lump    pt unsure what this is.    Patient Active Problem List   Diagnosis Date Noted  . Overweight 12/24/2016  . Benign neoplasm of colon 08/05/2013  . Family history of malignant neoplasm of gastrointestinal tract 08/05/2013    Past Surgical History:  Procedure Laterality Date  . BUNIONECTOMY  2013  . COLONOSCOPY    . LIPOMA EXCISION     x2/ on side abdomen  . TOE SURGERY  2011   Bil pinkey toe surgery/ removed the bones        Home Medications    Prior to Admission medications   Medication Sig Start Date End Date Taking? Authorizing Provider  HYDROcodone-acetaminophen (NORCO/VICODIN) 5-325 MG tablet Take 1-2 tablets by mouth every 6 (six) hours as needed. 07/15/18   Deliah Boston, PA-C    Family History Family History  Problem Relation Age of Onset  . Familial polyposis Father        multiple polyps (71-60) ; unknown pathology;  no cancer  . Cancer Paternal Grandmother 59       colon cancer; breast cancer in 61s  . Cancer Paternal Uncle 47       colorectal cancer  . Familial polyposis Paternal Uncle 23       polyp history; unknown type or number  . Cancer Other 98       1st cousin once removed (pat grandmother's sister's son) with colorectal cancer  . Colon polyps Brother     Social History Social History   Tobacco Use  . Smoking status: Never Smoker  . Smokeless tobacco: Never Used  Substance Use Topics  . Alcohol use: Yes    Comment: occasional/ twice a month  . Drug use: Never     Allergies   Other   Review of Systems Review of Systems  Constitutional: Negative.  Negative for chills and fever.  Respiratory: Negative.  Negative for cough and shortness of breath.   Cardiovascular: Negative.  Negative for chest pain.  Gastrointestinal: Negative.  Negative for abdominal pain, diarrhea, nausea and vomiting.  Musculoskeletal: Positive for arthralgias (Left ankle) and joint swelling (Left ankle). Negative for back pain and neck pain.  Skin: Negative.  Negative for color change and wound.  Neurological: Negative.  Negative for weakness, numbness and headaches.  All other systems reviewed and are negative.  Physical Exam Updated Vital Signs BP (!) 117/91 (BP  Location: Right Arm)   Pulse 98   Temp 98.1 F (36.7 C) (Oral)   Resp 18   Ht 6\' 3"  (1.905 m)   Wt 108.9 kg   SpO2 99%   BMI 30.00 kg/m   Physical Exam Constitutional:      General: He is not in acute distress.    Appearance: Normal appearance. He is well-developed. He is not ill-appearing or diaphoretic.  HENT:     Head: Normocephalic and atraumatic. No raccoon eyes or Battle's sign.     Jaw: There is normal jaw occlusion. No trismus.     Right Ear: Tympanic membrane, ear canal and external ear normal. No hemotympanum.     Left Ear: Tympanic membrane, ear canal and external ear normal. No hemotympanum.     Nose: Nose normal.      Mouth/Throat:     Mouth: Mucous membranes are moist.     Pharynx: Oropharynx is clear. Uvula midline.  Eyes:     General: Vision grossly intact. Gaze aligned appropriately.     Extraocular Movements: Extraocular movements intact.     Conjunctiva/sclera: Conjunctivae normal.     Pupils: Pupils are equal, round, and reactive to light.  Neck:     Musculoskeletal: Full passive range of motion without pain, normal range of motion and neck supple. No spinous process tenderness or muscular tenderness.     Trachea: Trachea and phonation normal. No tracheal tenderness or tracheal deviation.  Cardiovascular:     Rate and Rhythm: Normal rate and regular rhythm.     Pulses:          Dorsalis pedis pulses are 2+ on the right side and 2+ on the left side.       Posterior tibial pulses are 2+ on the right side and 2+ on the left side.  Pulmonary:     Effort: Pulmonary effort is normal. No accessory muscle usage or respiratory distress.     Breath sounds: Normal breath sounds and air entry.  Chest:     Comments: No sign of injury to the chest Abdominal:     General: There is no distension.     Palpations: Abdomen is soft.     Tenderness: There is no abdominal tenderness. There is no guarding or rebound.     Comments: No sign of injury to the abdomen  Musculoskeletal:     Right hip: He exhibits normal range of motion and normal strength.     Left hip: He exhibits normal range of motion and normal strength.     Right knee: Normal.     Left knee: Normal.     Right ankle: Normal.     Left ankle: He exhibits decreased range of motion and swelling. He exhibits no deformity. Tenderness. Lateral malleolus tenderness found. Achilles tendon normal.     Right lower leg: Normal.     Left lower leg: Normal.     Right foot: Normal.     Left foot: Normal.     Comments: No midline C/T/L spinal tenderness to palpation, no paraspinal muscle tenderness, no deformity, crepitus, or step-off noted. No sign of injury  to the neck or back.  No pain with palpation of the pelvis, patient able to bring bilateral knees to chest without pain or difficulty. - Left ankle: Moderate amount of pain and swelling to the anterior lateral ankle primarily around the lateral malleolus.  Capillary refill and sensation intact to all toes.  Full range of motion of the toes.  Resisted range of motion to flexion and extension of the left ankle intact.  Decreased range of motion secondary to pain.  Inversion and eversion of the left ankle intact with decreased range of motion due to pain.  Pedal pulses intact and equal bilaterally.  Compartments soft.  No overlying skin changes or wound.  Full range of motion appropriate strength of the left knee and left hip.  Feet:     Right foot:     Protective Sensation: 5 sites tested. 5 sites sensed.     Left foot:     Protective Sensation: 5 sites tested. 5 sites sensed.  Skin:    General: Skin is warm and dry.  Neurological:     Mental Status: He is alert.     GCS: GCS eye subscore is 4. GCS verbal subscore is 5. GCS motor subscore is 6.     Comments: Speech is clear and goal oriented, follows commands Major Cranial nerves without deficit, no facial droop Moves extremities without ataxia, coordination intact  Psychiatric:        Behavior: Behavior normal.      ED Treatments / Results  Labs (all labs ordered are listed, but only abnormal results are displayed) Labs Reviewed - No data to display  EKG None  Radiology Dg Ankle Complete Left  Result Date: 07/15/2018 CLINICAL DATA:  Lateral left ankle pain and swelling after an injury today. EXAM: LEFT ANKLE COMPLETE - 3+ VIEW COMPARISON:  09/12/2013 left foot radiographs FINDINGS: There is moderate soft tissue swelling about the ankle, greatest laterally and anteriorly. A nondisplaced, minimally angulated fracture of the anterior distal tibia is noted. The distal fibula appears intact. There is no dislocation. IMPRESSION:  Nondisplaced distal tibia fracture. Electronically Signed   By: Logan Bores M.D.   On: 07/15/2018 21:04    Procedures Procedures (including critical care time)  Medications Ordered in ED Medications - No data to display   Initial Impression / Assessment and Plan / ED Course  I have reviewed the triage vital signs and the nursing notes.  Pertinent labs & imaging results that were available during my care of the patient were reviewed by me and considered in my medical decision making (see chart for details).    38 year old male presents today for left ankle pain and swelling after jumping off of a small ledge around 6:30 PM.  He has swelling and pain primarily around the left anterior ankle and left lateral malleolus.  He is neurovascular intact to lower extremity with capillary refill and sensation intact to all toes.  Pedal pulses intact.  Compartments soft to palpation.  No open wounds or overlying skin changes.  No sign of septic joint, cellulitis, DVT or compartment syndrome.  Range of motion is intact with all movements of the left ankle however decreased secondary to pain.  He has no pain or decreased range of motion/strength of the left knee or left hip.  DG left ankle: IMPRESSION: Nondisplaced distal tibia fracture.   Case discussed and imaging reviewed with Dr. Sherry Ruffing, plan of care at this time is to apply a splint, give crutches, pain control and orthopedic referral. - Splint applied by orthopedic tech, crutches given.  PMP reviewed patient without any recent narcotic prescriptions, 10 pills of Norco prescribed for his acute fracture, discussed precautions regarding narcotics with the patient and he states understanding.  Discussed rice therapy with the patient.  Encouraged patient to call orthopedist tomorrow to schedule follow-up appointment.  NVI post splint application.  Additionally as to patient's fall he has no blood thinner use there is no loss of consciousness, head injury  or signs of injury to the neck back chest abdomen hips or other 3 extremities.  He is overall very well-appearing and in no acute distress.  At this time there does not appear to be any evidence of an acute emergency medical condition and the patient appears stable for discharge with appropriate outpatient follow up. Diagnosis was discussed with patient who verbalizes understanding of care plan and is agreeable to discharge. I have discussed return precautions with patient who verbalizes understanding of return precautions. Patient encouraged to follow-up with their PCP and ortho. All questions answered.  Patient has been discharged in good condition.  Patient's case discussed with Dr. Sherry Ruffing who agrees with plan to discharge with follow-up.   Note: Portions of this report may have been transcribed using voice recognition software. Every effort was made to ensure accuracy; however, inadvertent computerized transcription errors may still be present. Final Clinical Impressions(s) / ED Diagnoses   Final diagnoses:  Nondisplaced fracture of distal end of left tibia    ED Discharge Orders         Ordered    HYDROcodone-acetaminophen (NORCO/VICODIN) 5-325 MG tablet  Every 6 hours PRN     07/15/18 2235           Gari Crown 07/15/18 2244    Tegeler, Gwenyth Allegra, MD 07/16/18 [phone]

## 2018-07-16 DIAGNOSIS — M25572 Pain in left ankle and joints of left foot: Secondary | ICD-10-CM | POA: Diagnosis not present

## 2018-07-29 ENCOUNTER — Telehealth: Payer: Self-pay | Admitting: Gastroenterology

## 2018-07-29 DIAGNOSIS — Z8371 Family history of colonic polyps: Secondary | ICD-10-CM

## 2018-07-29 DIAGNOSIS — Z800 Family history of malignant neoplasm of digestive organs: Secondary | ICD-10-CM

## 2018-07-29 DIAGNOSIS — Z86010 Personal history of colonic polyps: Secondary | ICD-10-CM

## 2018-07-29 MED ORDER — NA SULFATE-K SULFATE-MG SULF 17.5-3.13-1.6 GM/177ML PO SOLN: 1.0000 | ORAL | 0 refills | Status: DC

## 2018-07-29 NOTE — Telephone Encounter (Signed)
Prep sent to pharmacy per patient request. Instructions faxed to patient and are available via mychart.

## 2018-07-30 ENCOUNTER — Telehealth: Payer: Self-pay | Admitting: *Deleted

## 2018-07-30 NOTE — Telephone Encounter (Signed)
Entered chart second time in error. SM

## 2018-07-30 NOTE — Telephone Encounter (Signed)
Covid-19 travel screening questions  Have you traveled in the last 14 days?no If yes where?  Do you now or have you had a fever in the last 14 days?no  Do you have any respiratory symptoms of shortness of breath or cough now or in the last 14 days?no  Do you have a medical history of Congestive Heart Failure?  Do you have a medical history of lung disease?  Do you have any family members or close contacts with diagnosed or suspected Covid-19?no  Pt aware of care partner policy and will bring a mask with them if available. SM

## 2018-07-31 ENCOUNTER — Other Ambulatory Visit: Payer: Self-pay | Admitting: *Deleted

## 2018-07-31 ENCOUNTER — Other Ambulatory Visit: Payer: Self-pay

## 2018-07-31 ENCOUNTER — Other Ambulatory Visit: Payer: Self-pay | Admitting: Gastroenterology

## 2018-07-31 ENCOUNTER — Encounter: Payer: Self-pay | Admitting: Gastroenterology

## 2018-07-31 ENCOUNTER — Ambulatory Visit (AMBULATORY_SURGERY_CENTER): Payer: Federal, State, Local not specified - PPO | Admitting: Gastroenterology

## 2018-07-31 VITALS — BP 124/59 | HR 63 | Temp 97.9°F | Resp 11 | Ht 76.0 in | Wt 241.0 lb

## 2018-07-31 DIAGNOSIS — Z86010 Personal history of colonic polyps: Secondary | ICD-10-CM | POA: Diagnosis not present

## 2018-07-31 DIAGNOSIS — Z800 Family history of malignant neoplasm of digestive organs: Secondary | ICD-10-CM | POA: Diagnosis not present

## 2018-07-31 DIAGNOSIS — K635 Polyp of colon: Secondary | ICD-10-CM | POA: Diagnosis not present

## 2018-07-31 DIAGNOSIS — D125 Benign neoplasm of sigmoid colon: Secondary | ICD-10-CM

## 2018-07-31 DIAGNOSIS — K639 Disease of intestine, unspecified: Secondary | ICD-10-CM

## 2018-07-31 DIAGNOSIS — Z1211 Encounter for screening for malignant neoplasm of colon: Secondary | ICD-10-CM | POA: Diagnosis not present

## 2018-07-31 MED ORDER — SODIUM CHLORIDE 0.9 % IV SOLN: 500.0000 mL | Freq: Once | INTRAVENOUS | Status: DC

## 2018-07-31 NOTE — Progress Notes (Signed)
Called to room to assist during endoscopic procedure.  Patient ID and intended procedure confirmed with present staff. Received instructions for my participation in the procedure from the performing physician.  

## 2018-07-31 NOTE — Progress Notes (Signed)
Pt's states no medical or surgical changes since previsit or office visit. 

## 2018-07-31 NOTE — Op Note (Addendum)
[facility] Patient Name: Mildred Bollard Procedure Date: 07/31/2018 1:40 PM MRN: [id number] Endoscopist: Thornton Park MD, MD Age: 38 Referring MD:  Date of Birth: Nov 14, 1980 Gender: Male Account #: [id number] Procedure:                Colonoscopy Indications:              Family history of colon cancer in multiple                            first-degree relatives (paternal cousin, uncle, and                            grandfather all prior to age 44)                           - Normal colonosopy with Dr. Laverta Baltimore 02/17/19                           - Lymphoid aggregate removed on colonoscopy with                            Dr. Wynetta Emery 06/03/13                           Patent reports recent concerns for appendicitis. Medicines:                See the Anesthesia note for documentation of the                            administered medications Procedure:                Pre-Anesthesia Assessment:                           - Prior to the procedure, a History and Physical                            was performed, and patient medications and                            allergies were reviewed. The patient's tolerance of                            previous anesthesia was also reviewed. The risks                            and benefits of the procedure and the sedation                            options and risks were discussed with the patient.                            All questions were answered, and informed consent  was obtained. Prior Anticoagulants: The patient has                            taken no previous anticoagulant or antiplatelet                            agents. ASA Grade Assessment: II - A patient with                            mild systemic disease. After reviewing the risks                            and benefits, the patient was deemed in                            satisfactory condition to undergo the procedure.  After obtaining informed consent, the colonoscope                            was passed under direct vision. Throughout the                            procedure, the patient's blood pressure, pulse, and                            oxygen saturations were monitored continuously. The                            Colonoscope was introduced through the anus and                            advanced to the the terminal ileum, with                            identification of the appendiceal orifice and IC                            valve. The colonoscopy was performed without                            difficulty. The patient tolerated the procedure                            well. The quality of the bowel preparation was                            good. The terminal ileum, ileocecal valve,                            appendiceal orifice, and rectum were photographed. Scope In: 1:56:42 PM Scope Out: 2:12:45 PM Scope Withdrawal Time: 0 hours 12 minutes 22 seconds  Total Procedure Duration: 0 hours 16 minutes 3 seconds  Findings:                 The  perianal and digital rectal examinations were                            normal.                           A 3 mm polyp was found in the distal sigmoid colon.                            The polyp was sessile. The polyp was removed with a                            cold snare. Resection and retrieval were complete.                            Estimated blood loss: none.                           The appendiceal orifice was prominant and appears                            to have a submucosal nodule. There was tip of                            mucous present on initial inspection but the                            overlying mucous otherwise appeared normal. When                            attempting a pillow sign with cold forceps,mucous                            spontaneously exuded from the orofice. Biopsies                            were taken with a  cold forceps for histology.                           The exam was otherwise without abnormality on                            direct and retroflexion views. Complications:            No immediate complications. Estimated blood loss:                            Minimal. Impression:               - One 3 mm polyp in the distal sigmoid colon,                            removed with a cold snare. Resected and retrieved.                           -  Suspected mucocele at the appendiceal orifice.                            Biopsied.                           - The examination was otherwise normal on direct                            and retroflexion views. Recommendation:           - Patient has a contact number available for                            emergencies. The signs and symptoms of potential                            delayed complications were discussed with the                            patient. Return to normal activities tomorrow.                            Written discharge instructions were provided to the                            patient.                           - Resume regular diet today.                           - Continue present medications.                           - Await pathology results. Recommendations for                            surveillance will be made after reviewing the                            pathology results.                           - CT of the abdomen and pelvis with contrast to                            further evaluate the appendieal orifice.                           - Referral to surgery for resection. Thornton Park MD, MD 07/31/2018 2:26:51 PM This report has been signed electronically.

## 2018-07-31 NOTE — Progress Notes (Signed)
To PACU, VSS. Report to [email or web address] 

## 2018-07-31 NOTE — Patient Instructions (Signed)
Handout given for polyps.  Contrast (2 bottles) given for up coming CT scan.  Dr. Payton Emerald office will call with a date and time.  Await pathology results.  YOU HAD AN ENDOSCOPIC PROCEDURE TODAY AT [facility]:   Refer to the procedure report that was given to you for any specific questions about what was found during the examination.  If the procedure report does not answer your questions, please call your gastroenterologist to clarify.  If you requested that your care partner not be given the details of your procedure findings, then the procedure report has been included in a sealed envelope for you to review at your convenience later.  YOU SHOULD EXPECT: Some feelings of bloating in the abdomen. Passage of more gas than usual.  Walking can help get rid of the air that was put into your GI tract during the procedure and reduce the bloating. If you had a lower endoscopy (such as a colonoscopy or flexible sigmoidoscopy) you may notice spotting of blood in your stool or on the toilet paper. If you underwent a bowel prep for your procedure, you may not have a normal bowel movement for a few days.  Please Note:  You might notice some irritation and congestion in your nose or some drainage.  This is from the oxygen used during your procedure.  There is no need for concern and it should clear up in a day or so.  SYMPTOMS TO REPORT IMMEDIATELY:   Following lower endoscopy (colonoscopy or flexible sigmoidoscopy):  Excessive amounts of blood in the stool  Significant tenderness or worsening of abdominal pains  Swelling of the abdomen that is new, acute  Fever of 100F or higher   For urgent or emergent issues, a gastroenterologist can be reached at any hour by calling [phone].   DIET:  We do recommend a small meal at first, but then you may proceed to your regular diet.  Drink plenty of fluids but you should avoid alcoholic beverages for 24 hours.  ACTIVITY:  You should  plan to take it easy for the rest of today and you should NOT DRIVE or use heavy machinery until tomorrow (because of the sedation medicines used during the test).    FOLLOW UP: Our staff will call the number listed on your records 48-72 hours following your procedure to check on you and address any questions or concerns that you may have regarding the information given to you following your procedure. If we do not reach you, we will leave a message.  We will attempt to reach you two times.  During this call, we will ask if you have developed any symptoms of COVID 19. If you develop any symptoms (ie: fever, flu-like symptoms, shortness of breath, cough etc.) before then, please call [phone].  If you test positive for Covid 19 in the 2 weeks post procedure, please call and report this information to [place].    If any biopsies were taken you will be contacted by phone or by letter within the next 1-3 weeks.  Please call us at [phone] if you have not heard about the biopsies in 3 weeks.    SIGNATURES/CONFIDENTIALITY: You and/or your care partner have signed paperwork which will be entered into your electronic medical record.  These signatures attest to the fact that that the information above on your After Visit Summary has been reviewed and is understood.  Full responsibility of the confidentiality of this discharge information lies with you  and/or your care-partner. 

## 2018-08-01 ENCOUNTER — Other Ambulatory Visit: Payer: Self-pay | Admitting: *Deleted

## 2018-08-01 ENCOUNTER — Telehealth: Payer: Self-pay | Admitting: *Deleted

## 2018-08-01 NOTE — Telephone Encounter (Signed)
Spoke to the patient who wanted a sooner date for the CT. This RN will follow up with Stacy to see if he can be scheduled sooner. Patient given contrast instructions over the phone and notified to pick up CT at the front desk.

## 2018-08-01 NOTE — Telephone Encounter (Signed)
[phone] patient called said that he would like to know if we were able to get a sooner appt. He also mention he does not mind going anywhere else to have it done he said he has great insurance.

## 2018-08-01 NOTE — Telephone Encounter (Signed)
Left patient a message to call back.   CT scheduled at [facility] on 08/13/2018 at 4:00 pm.  2 bottles of contrast left at the front desk with directions.

## 2018-08-04 ENCOUNTER — Telehealth: Payer: Self-pay | Admitting: *Deleted

## 2018-08-04 NOTE — Telephone Encounter (Signed)
  Follow up Call-  Call back number 07/31/2018  Post procedure Call Back phone  # [phone]  Permission to leave phone message Yes  Some recent data might be hidden     Patient questions:  Do you have a fever, pain , or abdominal swelling? No. Pain Score  0 *  Have you tolerated food without any problems? Yes.    Have you been able to return to your normal activities? Yes.    Do you have any questions about your discharge instructions: Diet   No. Medications  No. Follow up visit  No.  Do you have questions or concerns about your Care? No.  Actions: * If pain score is 4 or above: No action needed, pain <4.

## 2018-08-04 NOTE — Telephone Encounter (Signed)
Called  CT, spoke to [place] about an earlier appt, none available at this time.

## 2018-08-05 ENCOUNTER — Telehealth: Payer: Self-pay | Admitting: Gastroenterology

## 2018-08-05 NOTE — Telephone Encounter (Signed)
I am sorry that he continues to have some discomfort. Evaluation with CT and surgery is certainly needed but not urgent. However, if his pain is escalating, or, if there are any fever, chills, changes in bowel habits or other new symptoms, an interval evaluation in the ED would be appropriate.  Thank you.

## 2018-08-05 NOTE — Telephone Encounter (Signed)
Spoke to the patient and relayed Dr. Tarri Glenn message. The patient is agreeable with the plan for CT on 6/10 and subsequent appointment with [facility] and does not want to go to the ED for evaluation. Nothing further at this time.

## 2018-08-05 NOTE — Telephone Encounter (Signed)
Spoke to the patient who has called who is extremely anxious about mild persistent pain he has experienced since the endoscopy that he reports is intermittently "acute" at night. CT scan is ordered for 6/10 at 4:00 pm, the patient was disappointed with the timeline but this was the earliest time that this RN could have the CT scheduled and also have the prior auth from insurance completed. The patient wants Dr. Tarri Glenn to know of this mild persistent dull pain that he is having in hopes that this would somehow get him scheduled earlier. His CT is scheduled in 8 days and his appointment with [facility] is scheduled on 6/18. The patient is concerned and felt that he was led to believe his CT and surgery should have happened immediately after his endoscopy and he wanted Dr. Tarri Glenn to know this.

## 2018-08-05 NOTE — Telephone Encounter (Signed)
Pt called in wanting to speak with the nurse about the sched ct. He states that he has some problems with the timeline.

## 2018-08-11 DIAGNOSIS — S62366A Nondisplaced fracture of neck of fifth metacarpal bone, right hand, initial encounter for closed fracture: Secondary | ICD-10-CM | POA: Diagnosis not present

## 2018-08-11 DIAGNOSIS — M25572 Pain in left ankle and joints of left foot: Secondary | ICD-10-CM | POA: Diagnosis not present

## 2018-08-12 ENCOUNTER — Telehealth: Payer: Self-pay | Admitting: *Deleted

## 2018-08-12 NOTE — Telephone Encounter (Signed)
Spoke to the patient who continues to be extremely concerned about the condition of his appendix and upcoming appt with [facility]. Prior the the phone call with the patient, this RN called [facility] to see what could be done about getting the patient in sooner. The patient currently has an appt on 6/18 with [facility] and as of now cannot be seen earlier due to the backlog of patients that had to be rescheduled due to COVID-19. The patient's condition is not considered emergent even though he continues to have mild pain. This was explained to the patient. Even thought the patient was unhappy about this delay, he verbalized understanding and nothing further was needed at the end of the phone call.

## 2018-08-12 NOTE — Telephone Encounter (Signed)

## 2018-08-13 ENCOUNTER — Ambulatory Visit (INDEPENDENT_AMBULATORY_CARE_PROVIDER_SITE_OTHER)
Admission: RE | Admit: 2018-08-13 | Discharge: 2018-08-13 | Disposition: A | Discharge status: Home / Self Care | Payer: Federal, State, Local not specified - PPO | Source: Ambulatory Visit | Attending: Gastroenterology | Admitting: Gastroenterology

## 2018-08-13 ENCOUNTER — Other Ambulatory Visit: Payer: Self-pay

## 2018-08-13 DIAGNOSIS — K639 Disease of intestine, unspecified: Secondary | ICD-10-CM | POA: Diagnosis not present

## 2018-08-13 DIAGNOSIS — R1031 Right lower quadrant pain: Secondary | ICD-10-CM | POA: Diagnosis not present

## 2018-08-13 MED ORDER — IOHEXOL 300 MG/ML  SOLN
100.0000 mL | Freq: Once | INTRAMUSCULAR | Status: AC | PRN
  Administered 2018-08-13: 16:00:00 100 mL via INTRAVENOUS

## 2018-08-14 ENCOUNTER — Telehealth: Payer: Self-pay | Admitting: Gastroenterology

## 2018-08-14 NOTE — Telephone Encounter (Signed)
Looks like the study was normal. Any additional recommendations for patient?

## 2018-08-14 NOTE — Telephone Encounter (Signed)
Patient called would like to know his CT scan results that he had done yesterday.

## 2018-08-14 NOTE — Telephone Encounter (Signed)
Addressed in response to the results. Please let me know if you have any questions. Thank you.

## 2018-08-15 ENCOUNTER — Other Ambulatory Visit: Payer: Self-pay

## 2018-08-15 ENCOUNTER — Ambulatory Visit (INDEPENDENT_AMBULATORY_CARE_PROVIDER_SITE_OTHER): Payer: Federal, State, Local not specified - PPO | Admitting: Gastroenterology

## 2018-08-15 ENCOUNTER — Encounter: Payer: Self-pay | Admitting: Gastroenterology

## 2018-08-15 DIAGNOSIS — R195 Other fecal abnormalities: Secondary | ICD-10-CM | POA: Diagnosis not present

## 2018-08-15 DIAGNOSIS — R109 Unspecified abdominal pain: Secondary | ICD-10-CM

## 2018-08-15 NOTE — Patient Instructions (Signed)
I hope that your abdominal pain will improve with surgery. If not, please let me know and we will proceed with additional evaluation.  Please call with any additional questions or concerns in the meantime.

## 2018-08-15 NOTE — Progress Notes (Signed)
TELEHEALTH VISIT  Referring Provider: Darreld Mclean, MD Primary Care Physician:  Darreld Mclean, MD   Tele-visit due to COVID-19 pandemic Patient requested visit virtually, consented to the virtual encounter via video enabled telemedicine application (Dox[employer]) Contact made at: 08:00 08/15/18 Patient verified by name and date of birth Location of patient: Office Location provider: [facility] Names of persons participating: Me, patient, Tinnie Gens CMA Time spent on telehealth visit: 28 minutes I discussed the limitations of evaluation and management by telemedicine. The patient expressed understanding and agreed to proceed.  Chief complaint:  Abdominal pain, abnormal colonoscopy   IMPRESSION:  Mucous secreting appendiceal lesion noted on colonoscopy    - 6 months of mucous in the stool    - CT scan x 2 showing no worrisome findings in the area of the appendix    - attempted tunneled biopsies at time of colonoscopy showed normal mucosa Right sided abdominal pain x 6 months - ? Related to mucocele Gastric diverticulum (by CT) Family history of colon cancer in multiple second-degree relatives (paternal cousin, uncle, and grandfather all prior to age 34) Intermittent thrombocytopenia of unclear etiology    - platelets range from 134-163 since the first available labs in 2017  Although mucocele is suspected endoscopically, malignancy must be considered. CT scan showed no worrisome findings in the area of the appendix and no LAD. Surgical referral is underway. Unclear if his six months of right sided abdominal pain is related. I have recommended that we proceed with further evaluation if his symptoms do not resolve with surgery.    PLAN: Surgical referral - Dr. 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 He had had two prior colonoscopy - in 2010 ([facility]) and 2015 Wynetta Emery). A suspected mucocele was identified and was seen to be draining mucous during the colonoscopy. Biopsies from the overlying mucosa and attempted tunnel biopsies showed only normal colonic mucosa. A follow-up CT of the abd/pelvis 08/13/18 without contrast 08/13/18 showed no abnormalities except for a gastric diverticulum.  The appendix appeared normal.  A 45mm hyperplastic polyp was removed from the sigmoid colon.    He has been seeing mucous in the stool since December. Developed 4-5 out of 10, dull intermittently stabbing, about the size of a CD, migrating on the right flank between his hip and his second rib abdominal pain in January. Feels like he's been punched. Worsened with movement. No change with defecation or eating. No change in bowel habits. No blood.  Good appetite. He has gained weight during coronavirus.  He was seen in the ED in February.  The pain occurs at night. He is frustrated by the pain.  He fractured his hand last week and doesn't ever think about that pain, but, is thinking about his abdominal pain constantly. No other associated symptoms. No identified exacerbating or relieving features.   Evaluation in the ED 04/24/18 included a normal contrasted CT scan. CMP and CBC were normal except for a glucose of 110.  Platelets were on the lower side at 163 and have ranged from 134-163 since the first labs available to me in 2017. He was worried that it might be his appendix.   Past Medical History:  Diagnosis Date  . Family history of  colon cancer   . Localized superficial swelling, mass, or lump    pt unsure what this is.    Past Surgical History:  Procedure Laterality Date  . BUNIONECTOMY  2013  . COLONOSCOPY    . LIPOMA EXCISION     x2/ on side abdomen  . TOE SURGERY  2011   Bil pinkey toe surgery/ removed the  bones    Current Outpatient Medications  Medication Sig Dispense Refill  . HYDROcodone-acetaminophen (NORCO/VICODIN) 5-325 MG tablet Take 1-2 tablets by mouth every 6 (six) hours as needed. 10 tablet 0   Current Facility-Administered Medications  Medication Dose Route Frequency Provider Last Rate Last Dose  . 0.9 %  sodium chloride infusion  500 mL Intravenous Once Thornton Park, MD        Allergies as of 08/15/2018 - Review Complete 08/13/2018  Allergen Reaction Noted  . Other  04/24/2018    Family History  Problem Relation Age of Onset  . Familial polyposis Father        multiple polyps (60-60) ; unknown pathology; no cancer  . Cancer Paternal Grandmother 49       colon cancer; breast cancer in 9s  . Cancer Paternal Uncle 10       colorectal cancer  . Familial polyposis Paternal Uncle 29       polyp history; unknown type or number  . Cancer Other 19       1st cousin once removed (pat grandmother's sister's son) with colorectal cancer  . Colon polyps Brother     Social History   Socioeconomic History  . Marital status: Married    Spouse name: Not on file  . Number of children: Not on file  . Years of education: Not on file  . Highest education level: Not on file  Occupational History  . Not on file  Social Needs  . Financial resource strain: Not on file  . Food insecurity    Worry: Not on file    Inability: Not on file  . Transportation needs    Medical: Not on file    Non-medical: Not on file  Tobacco Use  . Smoking status: Never Smoker  . Smokeless tobacco: Never Used  Substance and Sexual Activity  . Alcohol use: Yes    Comment: occasional/ twice a month  . Drug use: Never  . Sexual activity: Not on file  Lifestyle  . Physical activity    Days per week: Not on file    Minutes per session: Not on file  . Stress: Not on file  Relationships  . Social Herbalist on phone: Not on file    Gets together: Not on file    Attends religious  service: Not on file    Active member of club or organization: Not on file    Attends meetings of clubs or organizations: Not on file    Relationship status: Not on file  . Intimate partner violence    Fear of current or ex partner: Not on file    Emotionally abused: Not on file    Physically abused: Not on file    Forced sexual activity: Not on file  Other Topics Concern  . Not on file  Social History Narrative  . Not on file    Physical Exam: General: in no acute distress Neuro: Alert and appropriate Psych: Normal affect and normal insight   Rikki Trosper L. Tarri Glenn, MD, MPH [facility] 08/15/2018, 7:59 AM

## 2018-08-21 DIAGNOSIS — R109 Unspecified abdominal pain: Secondary | ICD-10-CM | POA: Diagnosis not present

## 2018-08-21 DIAGNOSIS — K389 Disease of appendix, unspecified: Secondary | ICD-10-CM | POA: Diagnosis not present

## 2018-08-22 ENCOUNTER — Other Ambulatory Visit (HOSPITAL_COMMUNITY): Payer: Self-pay | Admitting: Surgery

## 2018-08-22 ENCOUNTER — Other Ambulatory Visit: Payer: Self-pay | Admitting: Surgery

## 2018-08-22 DIAGNOSIS — R109 Unspecified abdominal pain: Secondary | ICD-10-CM

## 2018-09-01 ENCOUNTER — Other Ambulatory Visit: Payer: Self-pay

## 2018-09-01 ENCOUNTER — Encounter (HOSPITAL_COMMUNITY)
Admission: RE | Admit: 2018-09-01 | Discharge: 2018-09-01 | Disposition: A | Discharge status: Home / Self Care | Payer: Federal, State, Local not specified - PPO | Source: Ambulatory Visit | Attending: Surgery | Admitting: Surgery

## 2018-09-01 ENCOUNTER — Ambulatory Visit (HOSPITAL_COMMUNITY)
Admission: RE | Admit: 2018-09-01 | Discharge: 2018-09-01 | Disposition: A | Discharge status: Home / Self Care | Payer: Federal, State, Local not specified - PPO | Source: Ambulatory Visit | Attending: Surgery | Admitting: Surgery

## 2018-09-01 DIAGNOSIS — R109 Unspecified abdominal pain: Secondary | ICD-10-CM | POA: Insufficient documentation

## 2018-09-01 DIAGNOSIS — R1011 Right upper quadrant pain: Secondary | ICD-10-CM | POA: Diagnosis not present

## 2018-09-01 MED ORDER — TECHNETIUM TC 99M MEBROFENIN IV KIT
4.9500 | PACK | Freq: Once | INTRAVENOUS | Status: AC | PRN
  Administered 2018-09-01: 09:00:00 4.95 via INTRAVENOUS

## 2018-09-02 ENCOUNTER — Other Ambulatory Visit (HOSPITAL_COMMUNITY): Payer: Self-pay | Admitting: Surgery

## 2018-09-15 ENCOUNTER — Other Ambulatory Visit (HOSPITAL_COMMUNITY)
Admission: RE | Admit: 2018-09-15 | Discharge: 2018-09-15 | Disposition: A | Discharge status: Home / Self Care | Payer: Federal, State, Local not specified - PPO | Source: Ambulatory Visit | Attending: Surgery | Admitting: Surgery

## 2018-09-15 DIAGNOSIS — Z1159 Encounter for screening for other viral diseases: Secondary | ICD-10-CM | POA: Insufficient documentation

## 2018-09-15 NOTE — Patient Instructions (Addendum)
YOU HAD  A COVID 19 TEST ON 09-15-2018.  ONCE YOUR COVID TEST IS COMPLETED, PLEASE BEGIN THE QUARANTINE INSTRUCTIONS AS OUTLINED IN YOUR HANDOUT.                Carl Rivera    Your procedure is scheduled on:  09-18-2018   Report to [facility] Main  Entrance  Report to  [facility]  At 5:30  AM      Call this number if you have problems the morning of surgery [phone]     Remember: Do not eat food or drink liquids :After Midnight. BRUSH YOUR TEETH MORNING OF SURGERY AND RINSE YOUR MOUTH OUT, NO CHEWING GUM CANDY OR MINTS.    FOLLOW ALL BOWEL PREP INSTRUCTIONS FROM DR [facility]   CLEAR LIQUID DIET   Foods Allowed                                                                     Foods Excluded  Coffee and tea, regular and decaf                             liquids that you cannot  Plain Jell-O in any flavor                                             see through such as: Fruit ices (not with fruit pulp)                                     milk, soups, orange juice  Iced Popsicles                                    All solid food Carbonated beverages, regular and diet                                    Cranberry, grape and apple juices Sports drinks like Gatorade Lightly seasoned clear broth or consume(fat free) Sugar, honey syrup  Sample Menu Breakfast                                Lunch                                     Supper Cranberry juice                    Beef broth                            Chicken broth Jell-O  Grape juice                           Apple juice Coffee or tea                        Jell-O                                      Popsicle                                                Coffee or tea                        Coffee or tea  _____________________________________________________________________     Take these medicines the morning of surgery with A SIP OF WATER:  none                                 You may not have any metal on your body including piercings              Do not wear jewelry,  lotions, powders or , deodorant             Do not wear nail polish.  Do not shave  48 hours prior to surgery.              Men may shave face and neck.   Do not bring valuables to the hospital. [place].  Contacts, dentures or bridgework may not be worn into surgery.      Patients discharged the day of surgery will not be allowed to drive home . IF YOU ARE HAVING SURGERY AND GOING HOME THE SAME DAY, YOU MUST HAVE AN ADULT TO DRIVE YOU HOME AND BE WITH YOU FOR 24 HOURS.  YOU MAY GO HOME BY TAXI OR UBER OR ORTHERWISE, BUT AN ADULT MUST ACCOMPANY YOU HOME AND STAY WITH YOU FOR 24 HOURS.  Name and phone number of your driver:  Special Instructions: N/A              Please read over the following fact sheets you were given: _____________________________________________________________________             [facility] - Preparing for Surgery Before surgery, you can play an important role.   Because skin is not sterile, your skin needs to be as free of germs as possible .  You can reduce the number of germs on your skin by washing with CHG (chlorahexidine gluconate) soap before surgery.   CHG is an antiseptic cleaner which kills germs and bonds with the skin to continue killing germs even after washing. Please DO NOT use if you have an allergy to CHG or antibacterial soaps.   If your skin becomes reddened/irritated stop using the CHG and inform your nurse when you arrive at Short Stay. .  You may shave your face/neck. Please follow these instructions carefully:  1.  Shower with CHG Soap the night before surgery and the  morning  of Surgery.  2.  If you choose to wash your hair, wash your hair first as usual with your  normal  shampoo.  3.  After you shampoo, rinse your hair and body thoroughly to remove the  shampoo.                                         4.  Use CHG as you would any other liquid soap.  You can apply chg directly  to the skin and wash                       Gently with a scrungie or clean washcloth.  5.  Apply the CHG Soap to your body ONLY FROM THE NECK DOWN.   Do not use on face/ open                           Wound or open sores. Avoid contact with eyes, ears mouth and genitals (private parts).                       Wash face,  Genitals (private parts) with your normal soap.             6.  Wash thoroughly, paying special attention to the area where your surgery  will be performed.  7.  Thoroughly rinse your body with warm water from the neck down.  8.  DO NOT shower/wash with your normal soap after using and rinsing off  the CHG Soap.             9.  Pat yourself dry with a clean towel.            10.  Wear clean pajamas.            11.  Place clean sheets on your bed the night of your first shower and do not  sleep with pets.  Day of Surgery : Do not apply any lotions/deodorants the morning of surgery.  Please wear clean clothes to the hospital/surgery center.   FAILURE TO FOLLOW THESE INSTRUCTIONS MAY RESULT IN THE CANCELLATION OF YOUR SURGERY PATIENT SIGNATURE_________________________________  NURSE SIGNATURE__________________________________  ________________________________________________________________________

## 2018-09-16 ENCOUNTER — Encounter (HOSPITAL_COMMUNITY): Payer: Self-pay

## 2018-09-16 ENCOUNTER — Encounter (HOSPITAL_COMMUNITY)
Admission: RE | Admit: 2018-09-16 | Discharge: 2018-09-16 | Disposition: A | Discharge status: Home / Self Care | Payer: Federal, State, Local not specified - PPO | Source: Ambulatory Visit | Attending: Surgery | Admitting: Surgery

## 2018-09-16 ENCOUNTER — Other Ambulatory Visit: Payer: Self-pay

## 2018-09-16 DIAGNOSIS — Z01812 Encounter for preprocedural laboratory examination: Secondary | ICD-10-CM | POA: Insufficient documentation

## 2018-09-16 LAB — CBC WITH DIFFERENTIAL/PLATELET
Abs Immature Granulocytes: 0.03 10*3/uL (ref 0.00–0.07)
Basophils Absolute: 0 10*3/uL (ref 0.0–0.1)
Basophils Relative: 0 %
Eosinophils Absolute: 0.2 10*3/uL (ref 0.0–0.5)
Eosinophils Relative: 3 %
HCT: 47.1 % (ref 39.0–52.0)
Hemoglobin: 15.7 g/dL (ref 13.0–17.0)
Immature Granulocytes: 1 %
Lymphocytes Relative: 41 %
Lymphs Abs: 2.5 10*3/uL (ref 0.7–4.0)
MCH: 29 pg (ref 26.0–34.0)
MCHC: 33.3 g/dL (ref 30.0–36.0)
MCV: 86.9 fL (ref 80.0–100.0)
Monocytes Absolute: 0.4 10*3/uL (ref 0.1–1.0)
Monocytes Relative: 7 %
Neutro Abs: 3 10*3/uL (ref 1.7–7.7)
Neutrophils Relative %: 48 %
Platelets: 167 10*3/uL (ref 150–400)
RBC: 5.42 MIL/uL (ref 4.22–5.81)
RDW: 12.4 % (ref 11.5–15.5)
WBC: 6.2 10*3/uL (ref 4.0–10.5)
nRBC: 0 % (ref 0.0–0.2)

## 2018-09-16 LAB — SURGICAL PCR SCREEN
MRSA, PCR: NEGATIVE
Staphylococcus aureus: NEGATIVE

## 2018-09-16 LAB — SARS CORONAVIRUS 2 (TAT 6-24 HRS): SARS Coronavirus 2: NEGATIVE

## 2018-09-17 ENCOUNTER — Encounter (HOSPITAL_COMMUNITY): Payer: Self-pay | Admitting: Anesthesiology

## 2018-09-17 MED ORDER — BUPIVACAINE LIPOSOME 1.3 % IJ SUSP
20.0000 mL | INTRAMUSCULAR | Status: DC
  Filled 2018-09-17: qty 20

## 2018-09-17 NOTE — H&P (Signed)
Carl Rivera  Location: [facility] Surgery Patient #: [id number] DOB: 03-May-1980 Married / Language: English / Race: White Male  History of Present Illness   The patient is a 38 year old male who presents with a complaint of abdominal pain.  The PCP is Dr. Janett Billow Copland  The patient was referred by Dr. Lottie Mussel.  He comes by himself.  [The Covid-19 virus has disrupted normal medical care in [place] and across the nation. We have sometimes had to alter normal surgical/medical care to limit this epidemic and we have explained these changes to the patient.]  The patient started hurting in January 2020. At first they thought he had appendicitis, but his CT scan proved negative. He's had almost constant right abdominal/right flank pain over the last 5 months. He's had occasional nausea and vomiting, but no more than monthly. He's had loose stools since this was identified. He denies any trauma, any known kidney disease, or any other gastrointestinal disease. He is clearly been uncomfortable for several months now. He's been frustrated, because the coronavirus has interfered with part of his evaluation.  He is referred for a "mucous secreting appendiceal lesion" noted on colonoscopy by Dr. Tarri Glenn. He had a colonoscopy on 31 Jul 2018 by Dr. Tarri Glenn that showed a 3 millimeter polyp of his distal sigmoid colon and the appendiceal orifice with a submucosal nodule a mucous present on initial inspection. Biopsy of the appendiceal orifice was negative. It looks like he had undergone 2 colonoscopies prior to this most recent. He had a colonoscopy by Dr. Mellody Memos on 07/03/2013 and Dr. Laverta Baltimore at [facility] on 02/16/2009. He had a CT scan of the abdomen on 04/24/2018 and again on 08/13/2018. This was a normal CT scan.  We had a lengthy discussion about his symptoms and the colonoscopy findings. It was seen to be appropriate to proceed with a  laparoscopic appendectomy. At the same time I can do a laparoscopic exploration for other causes of abdominal pain. With 2 negative CT scans, the chance of finding something else is probably low. The risk of surgery includes bleeding, infection, nerve injury, leak from bowel, and the possibility of needing more surgery. I discussed with him the options for treatment. With an abnormal orifice seen on colonoscopy, it certainly seems appropriate to consider a appendectomy. I am uncertain though this will resolve his abdominal pain symptoms. Since the laparoscoping him, I think it is worth looking at his gallbladder with an ultrasound and HIDA scan. If the gallbladder workup is negative, we will limit our surgery to the appendix. At the gallbladder workup is abnormal, would consider doing a cholecystectomy at the same time as appendectomy.   Plan: 1) [place] of abdomen/gall bladder, 2) HIDA scan of gall bladder, 3) Laparoscopic appendectomy  Review of Systems as stated in this history (HPI) or in the review of systems. Otherwise all other 12 point ROS are negative  Past Medical History: 1. He has strong family history of colon cancer 2. Fx right 5th metacarpal - [place] 3. Left tibial fx - Jan, 2020 - left ankels swells  Social History: Married, Shirlean Mylar 4 children: youngest born 2019 - has 66 yo, 37 yo, 37 yo, and 37 yo. Works as an Forensic psychologist, Research officer, political party, federal indigent work   Problem List/Past [place] ([place], [place]; 08/21/2018 9:54 AM) APPENDIX DISEASE (K38.9)   Past Surgical History Nance Pew, [place]; 08/21/2018 9:53 AM) Vasectomy   Diagnostic Studies History Nance Pew, [place]; 08/21/2018 9:53 AM) Colonoscopy  within last year  Allergies Nance Pew, CMA; 08/21/2018 9:54 AM) No Known Allergies  [08/21/2018]: No Known Drug Allergies  [08/21/2018]: Allergies Reconciled   Medication History Nance Pew, CMA; 08/21/2018 9:54 AM) No Current  Medications Medications Reconciled  Social History Nance Pew, CMA; 08/21/2018 9:53 AM) Alcohol use  Occasional alcohol use. Caffeine use  Carbonated beverages, Tea. No drug use  Tobacco use  Never smoker.  Family History Nance Pew, [place]; 08/21/2018 9:53 AM) Colon Polyps  Father. Heart disease in male family member before age 43  Heart disease in male family member before age 26     Review of Systems Nance Pew CMA; 08/21/2018 9:53 AM) Cardiovascular Not Present- Chest Pain, Difficulty Breathing Lying Down, Leg Cramps, Palpitations, Rapid Heart Rate, Shortness of Breath and Swelling of Extremities. Psychiatric Present- Anxiety and Fearful. Not Present- Bipolar, Change in Sleep Pattern, Depression and Frequent crying. Endocrine Not Present- Cold Intolerance, Excessive Hunger, Hair Changes, Heat Intolerance, Hot flashes and New Diabetes.  Vitals (Sabrina Canty CMA; 08/21/2018 9:55 AM) 08/21/2018 9:54 AM Weight: 240.8 lb Height: 75in Body Surface Area: 2.37 m Body Mass Index: 30.1 kg/m  Temp.: 97.73F (Temporal)  Pulse: 97 (Regular)  BP: 160/82(Sitting, Left Arm, Standard)   Physical Exam  General: WN WM who is alert and generally healthy appearing. HEENT: Normal. Pupils equal.  Neck: Supple. No mass. No thyroid mass. Lymph Nodes: No supraclavicular or cervical nodes.  Lungs: Clear to auscultation and symmetric breath sounds. Heart: RRR. No murmur or rub.  Abdomen: Soft. No mass. No hernia. Normal bowel sounds. No abdominal scars.  He has fairly focal pain in his mid axillary line in his right mid flank. He has some lesser pain in his right upper quadrant and left lower quadrant. I feel no mass, no hernia, no defect in his abdominal wall. There are no skin changes. He has no back or CVA tenderness. He has no evidence of inguinal hernia. Rectal: Not done.  Extremities: Good strength and ROM in upper and lower extremities.  Neurologic: Grossly  intact to motor and sensory function. Psychiatric: Has normal mood and affect. Behavior is normal.   Assessment & Plan  1.  APPENDIX DISEASE (K38.9)   2.  ABDOMINAL PAIN, RIGHT LATERAL (R10.9) Plan:  1) [place] of abdomen/gall bladder  Gall bladder US and HIDA scan - 09/01/2018 - negative  2) HIDA scan of gall bladder   3) With negative biliary studies, will proceed with laparoscopic appendectomy   Alphonsa Overall, MD, [facility] Pager: [phone] Office phone:  [phone]

## 2018-09-17 NOTE — Anesthesia Preprocedure Evaluation (Addendum)
Anesthesia Evaluation  Patient identified by MRN, date of birth, ID band Patient awake    Reviewed: Allergy & Precautions, NPO status , Patient's Chart, lab work & pertinent test results  Airway Mallampati: I  TM Distance: <3 FB Neck ROM: Full    Dental  (+) Teeth Intact, Dental Advisory Given   Pulmonary neg pulmonary ROS,    breath sounds clear to auscultation       Cardiovascular negative cardio ROS   Rhythm:Regular Rate:Normal     Neuro/Psych negative neurological ROS     GI/Hepatic negative GI ROS, Neg liver ROS,   Endo/Other  negative endocrine ROS  Renal/GU negative Renal ROS     Musculoskeletal   Abdominal   Peds  Hematology   Anesthesia Other Findings   Reproductive/Obstetrics                            Anesthesia Physical Anesthesia Plan  ASA: II  Anesthesia Plan: General   Post-op Pain Management:    Induction: Intravenous  PONV Risk Score and Plan: 3 and Ondansetron, Dexamethasone and Midazolam  Airway Management Planned: Oral ETT  Additional Equipment: None  Intra-op Plan:   Post-operative Plan: Extubation in OR  Informed Consent: I have reviewed the patients History and Physical, chart, labs and discussed the procedure including the risks, benefits and alternatives for the proposed anesthesia with the patient or authorized representative who has indicated his/her understanding and acceptance.     Dental advisory given  Plan Discussed with: CRNA  Anesthesia Plan Comments:        Anesthesia Quick Evaluation

## 2018-09-18 ENCOUNTER — Ambulatory Visit (HOSPITAL_COMMUNITY): Payer: Federal, State, Local not specified - PPO | Admitting: Certified Registered Nurse Anesthetist

## 2018-09-18 ENCOUNTER — Other Ambulatory Visit: Payer: Self-pay

## 2018-09-18 ENCOUNTER — Ambulatory Visit (HOSPITAL_COMMUNITY): Payer: Federal, State, Local not specified - PPO | Admitting: Emergency Medicine

## 2018-09-18 ENCOUNTER — Observation Stay (HOSPITAL_COMMUNITY)
Admission: RE | Admit: 2018-09-18 | Discharge: 2018-09-19 | Disposition: A | Discharge status: Home / Self Care | Payer: Federal, State, Local not specified - PPO | Attending: Surgery | Admitting: Surgery

## 2018-09-18 ENCOUNTER — Encounter (HOSPITAL_COMMUNITY)
Admission: RE | Disposition: A | Discharge status: Home / Self Care | Payer: Self-pay | Source: Home / Self Care | Attending: Surgery

## 2018-09-18 ENCOUNTER — Encounter (HOSPITAL_COMMUNITY): Payer: Self-pay | Admitting: Emergency Medicine

## 2018-09-18 DIAGNOSIS — R1031 Right lower quadrant pain: Secondary | ICD-10-CM | POA: Diagnosis present

## 2018-09-18 DIAGNOSIS — K388 Other specified diseases of appendix: Secondary | ICD-10-CM | POA: Diagnosis not present

## 2018-09-18 DIAGNOSIS — R109 Unspecified abdominal pain: Secondary | ICD-10-CM | POA: Diagnosis not present

## 2018-09-18 DIAGNOSIS — D126 Benign neoplasm of colon, unspecified: Secondary | ICD-10-CM | POA: Diagnosis not present

## 2018-09-18 DIAGNOSIS — K37 Unspecified appendicitis: Secondary | ICD-10-CM | POA: Diagnosis not present

## 2018-09-18 DIAGNOSIS — K389 Disease of appendix, unspecified: Secondary | ICD-10-CM | POA: Diagnosis not present

## 2018-09-18 HISTORY — PX: LAPAROSCOPIC APPENDECTOMY: SHX408

## 2018-09-18 SURGERY — APPENDECTOMY, LAPAROSCOPIC
Anesthesia: General | Site: Abdomen

## 2018-09-18 MED ORDER — PHENYLEPHRINE HCL (PRESSORS) 10 MG/ML IV SOLN
INTRAVENOUS | Status: AC
  Filled 2018-09-18: qty 2

## 2018-09-18 MED ORDER — ACETAMINOPHEN 325 MG PO TABS: 325.0000 mg | ORAL_TABLET | Freq: Once | ORAL | Status: DC | PRN

## 2018-09-18 MED ORDER — FENTANYL CITRATE (PF) 100 MCG/2ML IJ SOLN
INTRAMUSCULAR | Status: DC | PRN
  Administered 2018-09-18: 100 ug via INTRAVENOUS
  Administered 2018-09-18: 50 ug via INTRAVENOUS
  Administered 2018-09-18: 100 ug via INTRAVENOUS
  Administered 2018-09-18: 50 ug via INTRAVENOUS

## 2018-09-18 MED ORDER — FENTANYL CITRATE (PF) 100 MCG/2ML IJ SOLN
INTRAMUSCULAR | Status: AC
  Filled 2018-09-18: qty 2

## 2018-09-18 MED ORDER — LACTATED RINGERS IV SOLN: INTRAVENOUS | Status: DC

## 2018-09-18 MED ORDER — PROMETHAZINE HCL 25 MG/ML IJ SOLN: 6.2500 mg | INTRAMUSCULAR | Status: DC | PRN

## 2018-09-18 MED ORDER — LACTATED RINGERS IR SOLN
Status: DC | PRN
  Administered 2018-09-18: 1000 mL

## 2018-09-18 MED ORDER — MEPERIDINE HCL 50 MG/ML IJ SOLN
INTRAMUSCULAR | Status: AC
  Filled 2018-09-18: qty 1

## 2018-09-18 MED ORDER — CHLORHEXIDINE GLUCONATE CLOTH 2 % EX PADS: 6.0000 | MEDICATED_PAD | Freq: Once | CUTANEOUS | Status: DC

## 2018-09-18 MED ORDER — DEXAMETHASONE SODIUM PHOSPHATE 10 MG/ML IJ SOLN
INTRAMUSCULAR | Status: AC
  Filled 2018-09-18: qty 1

## 2018-09-18 MED ORDER — ONDANSETRON HCL 4 MG/2ML IJ SOLN
INTRAMUSCULAR | Status: AC
  Filled 2018-09-18: qty 2

## 2018-09-18 MED ORDER — FENTANYL CITRATE (PF) 100 MCG/2ML IJ SOLN
25.0000 ug | INTRAMUSCULAR | Status: DC | PRN
  Administered 2018-09-18 (×2): 50 ug via INTRAVENOUS

## 2018-09-18 MED ORDER — SUGAMMADEX SODIUM 200 MG/2ML IV SOLN
INTRAVENOUS | Status: AC
  Filled 2018-09-18: qty 4

## 2018-09-18 MED ORDER — FENTANYL CITRATE (PF) 100 MCG/2ML IJ SOLN
INTRAMUSCULAR | Status: AC
  Administered 2018-09-18: 50 ug via INTRAVENOUS
  Filled 2018-09-18: qty 2

## 2018-09-18 MED ORDER — LIDOCAINE 2% (20 MG/ML) 5 ML SYRINGE
INTRAMUSCULAR | Status: AC
  Filled 2018-09-18: qty 5

## 2018-09-18 MED ORDER — PROPOFOL 10 MG/ML IV BOLUS
INTRAVENOUS | Status: AC
  Filled 2018-09-18: qty 40

## 2018-09-18 MED ORDER — SUGAMMADEX SODIUM 200 MG/2ML IV SOLN
INTRAVENOUS | Status: AC
  Filled 2018-09-18: qty 2

## 2018-09-18 MED ORDER — ROCURONIUM BROMIDE 10 MG/ML (PF) SYRINGE
PREFILLED_SYRINGE | INTRAVENOUS | Status: AC
  Filled 2018-09-18: qty 10

## 2018-09-18 MED ORDER — KETAMINE HCL 10 MG/ML IJ SOLN
INTRAMUSCULAR | Status: DC | PRN
  Administered 2018-09-18: 40 mg via INTRAVENOUS

## 2018-09-18 MED ORDER — ONDANSETRON HCL 4 MG/2ML IJ SOLN
INTRAMUSCULAR | Status: DC | PRN
  Administered 2018-09-18: 4 mg via INTRAVENOUS

## 2018-09-18 MED ORDER — KETAMINE HCL 10 MG/ML IJ SOLN
INTRAMUSCULAR | Status: AC
  Filled 2018-09-18: qty 1

## 2018-09-18 MED ORDER — SODIUM CHLORIDE 0.9 % IV SOLN
2.0000 g | INTRAVENOUS | Status: AC
  Administered 2018-09-18: 2 g via INTRAVENOUS
  Filled 2018-09-18: qty 2

## 2018-09-18 MED ORDER — EPHEDRINE 5 MG/ML INJ
INTRAVENOUS | Status: AC
  Filled 2018-09-18: qty 10

## 2018-09-18 MED ORDER — ONDANSETRON 4 MG PO TBDP: 4.0000 mg | ORAL_TABLET | Freq: Four times a day (QID) | ORAL | Status: DC | PRN

## 2018-09-18 MED ORDER — PROPOFOL 10 MG/ML IV BOLUS
INTRAVENOUS | Status: DC | PRN
  Administered 2018-09-18: 170 mg via INTRAVENOUS

## 2018-09-18 MED ORDER — BUPIVACAINE-EPINEPHRINE (PF) 0.25% -1:200000 IJ SOLN
INTRAMUSCULAR | Status: AC
  Filled 2018-09-18: qty 30

## 2018-09-18 MED ORDER — ONDANSETRON HCL 4 MG/2ML IJ SOLN: 4.0000 mg | Freq: Four times a day (QID) | INTRAMUSCULAR | Status: DC | PRN

## 2018-09-18 MED ORDER — ACETAMINOPHEN 10 MG/ML IV SOLN
INTRAVENOUS | Status: AC
  Filled 2018-09-18: qty 100

## 2018-09-18 MED ORDER — KCL IN DEXTROSE-NACL 20-5-0.45 MEQ/L-%-% IV SOLN
INTRAVENOUS | Status: DC
  Administered 2018-09-18 (×2): via INTRAVENOUS
  Filled 2018-09-18 (×2): qty 1000

## 2018-09-18 MED ORDER — MORPHINE SULFATE (PF) 2 MG/ML IV SOLN: 1.0000 mg | INTRAVENOUS | Status: DC | PRN

## 2018-09-18 MED ORDER — MEPERIDINE HCL 50 MG/ML IJ SOLN
6.2500 mg | INTRAMUSCULAR | Status: DC | PRN
  Administered 2018-09-18: 6.25 mg via INTRAVENOUS

## 2018-09-18 MED ORDER — LACTATED RINGERS IV SOLN
INTRAVENOUS | Status: DC
  Administered 2018-09-18 (×3): via INTRAVENOUS

## 2018-09-18 MED ORDER — LIDOCAINE 2% (20 MG/ML) 5 ML SYRINGE
INTRAMUSCULAR | Status: DC | PRN
  Administered 2018-09-18: 60 mg via INTRAVENOUS

## 2018-09-18 MED ORDER — ACETAMINOPHEN 160 MG/5ML PO SOLN: 325.0000 mg | Freq: Once | ORAL | Status: DC | PRN

## 2018-09-18 MED ORDER — ACETAMINOPHEN 10 MG/ML IV SOLN
1000.0000 mg | Freq: Once | INTRAVENOUS | Status: DC | PRN
  Administered 2018-09-18: 1000 mg via INTRAVENOUS

## 2018-09-18 MED ORDER — ENOXAPARIN SODIUM 40 MG/0.4ML ~~LOC~~ SOLN
40.0000 mg | SUBCUTANEOUS | Status: DC
  Filled 2018-09-18: qty 0.4

## 2018-09-18 MED ORDER — CELECOXIB 200 MG PO CAPS
200.0000 mg | ORAL_CAPSULE | ORAL | Status: AC
  Administered 2018-09-18: 200 mg via ORAL
  Filled 2018-09-18: qty 1

## 2018-09-18 MED ORDER — MIDAZOLAM HCL 5 MG/5ML IJ SOLN
INTRAMUSCULAR | Status: DC | PRN
  Administered 2018-09-18: 2 mg via INTRAVENOUS

## 2018-09-18 MED ORDER — ACETAMINOPHEN 500 MG PO TABS
1000.0000 mg | ORAL_TABLET | ORAL | Status: AC
  Administered 2018-09-18: 1000 mg via ORAL
  Filled 2018-09-18: qty 2

## 2018-09-18 MED ORDER — SUGAMMADEX SODIUM 200 MG/2ML IV SOLN
INTRAVENOUS | Status: DC | PRN
  Administered 2018-09-18: 300 mg via INTRAVENOUS

## 2018-09-18 MED ORDER — DEXAMETHASONE SODIUM PHOSPHATE 4 MG/ML IJ SOLN
INTRAMUSCULAR | Status: DC | PRN
  Administered 2018-09-18: 10 mg via INTRAVENOUS

## 2018-09-18 MED ORDER — ROCURONIUM BROMIDE 10 MG/ML (PF) SYRINGE
PREFILLED_SYRINGE | INTRAVENOUS | Status: DC | PRN
  Administered 2018-09-18: 10 mg via INTRAVENOUS
  Administered 2018-09-18: 5 mg via INTRAVENOUS
  Administered 2018-09-18: 50 mg via INTRAVENOUS
  Administered 2018-09-18: 10 mg via INTRAVENOUS

## 2018-09-18 MED ORDER — FENTANYL CITRATE (PF) 250 MCG/5ML IJ SOLN
INTRAMUSCULAR | Status: AC
  Filled 2018-09-18: qty 5

## 2018-09-18 MED ORDER — MIDAZOLAM HCL 2 MG/2ML IJ SOLN
INTRAMUSCULAR | Status: AC
  Filled 2018-09-18: qty 2

## 2018-09-18 MED ORDER — HYDROCODONE-ACETAMINOPHEN 5-325 MG PO TABS: 1.0000 | ORAL_TABLET | ORAL | Status: DC | PRN

## 2018-09-18 MED ORDER — TRAMADOL HCL 50 MG PO TABS
50.0000 mg | ORAL_TABLET | Freq: Four times a day (QID) | ORAL | Status: DC | PRN
  Administered 2018-09-18 – 2018-09-19 (×3): 50 mg via ORAL
  Filled 2018-09-18 (×3): qty 1

## 2018-09-18 MED ORDER — BUPIVACAINE-EPINEPHRINE 0.25% -1:200000 IJ SOLN
INTRAMUSCULAR | Status: DC | PRN
  Administered 2018-09-18: 30 mL

## 2018-09-18 MED ORDER — PHENYLEPHRINE 40 MCG/ML (10ML) SYRINGE FOR IV PUSH (FOR BLOOD PRESSURE SUPPORT)
PREFILLED_SYRINGE | INTRAVENOUS | Status: AC
  Filled 2018-09-18: qty 10

## 2018-09-18 MED ORDER — 0.9 % SODIUM CHLORIDE (POUR BTL) OPTIME
TOPICAL | Status: DC | PRN
  Administered 2018-09-18: 1000 mL

## 2018-09-18 SURGICAL SUPPLY — 37 items
APPLIER CLIP ROT 10 11.4 M/L (STAPLE)
BENZOIN TINCTURE PRP APPL 2/3 (GAUZE/BANDAGES/DRESSINGS) IMPLANT
CABLE HIGH FREQUENCY MONO STRZ (ELECTRODE) ×2 IMPLANT
CHLORAPREP W/TINT 26 (MISCELLANEOUS) ×2 IMPLANT
CLIP APPLIE ROT 10 11.4 M/L (STAPLE) IMPLANT
COVER SURGICAL LIGHT HANDLE (MISCELLANEOUS) ×2 IMPLANT
COVER WAND RF STERILE (DRAPES) IMPLANT
CUTTER FLEX LINEAR 45M (STAPLE) ×2 IMPLANT
DECANTER SPIKE VIAL GLASS SM (MISCELLANEOUS) ×2 IMPLANT
DERMABOND ADVANCED (GAUZE/BANDAGES/DRESSINGS) ×1
DERMABOND ADVANCED .7 DNX12 (GAUZE/BANDAGES/DRESSINGS) ×1 IMPLANT
ELECT REM PT RETURN 15FT ADLT (MISCELLANEOUS) ×2 IMPLANT
ENDOLOOP SUT PDS II  0 18 (SUTURE)
ENDOLOOP SUT PDS II 0 18 (SUTURE) IMPLANT
GLOVE SURG SIGNA 7.5 PF LTX (GLOVE) ×2 IMPLANT
GOWN STRL REUS W/TWL XL LVL3 (GOWN DISPOSABLE) ×4 IMPLANT
KIT BASIN OR (CUSTOM PROCEDURE TRAY) ×2 IMPLANT
KIT TURNOVER KIT A (KITS) IMPLANT
POUCH RETRIEVAL ECOSAC 10 (ENDOMECHANICALS) ×1 IMPLANT
POUCH RETRIEVAL ECOSAC 10MM (ENDOMECHANICALS) ×1
POUCH SPECIMEN RETRIEVAL 10MM (ENDOMECHANICALS) IMPLANT
RELOAD 45 VASCULAR/THIN (ENDOMECHANICALS) IMPLANT
RELOAD STAPLE TA45 3.5 REG BLU (ENDOMECHANICALS) ×4 IMPLANT
SCISSORS LAP 5X35 DISP (ENDOMECHANICALS) ×2 IMPLANT
SET IRRIG TUBING LAPAROSCOPIC (IRRIGATION / IRRIGATOR) ×2 IMPLANT
SET TUBE SMOKE EVAC HIGH FLOW (TUBING) ×2 IMPLANT
SHEARS HARMONIC ACE PLUS 36CM (ENDOMECHANICALS) ×2 IMPLANT
SLEEVE XCEL OPT CAN 5 100 (ENDOMECHANICALS) ×2 IMPLANT
STRIP CLOSURE SKIN 1/2X4 (GAUZE/BANDAGES/DRESSINGS) IMPLANT
SUT MNCRL AB 4-0 PS2 18 (SUTURE) ×2 IMPLANT
SUT VIC AB 2-0 SH 18 (SUTURE) IMPLANT
SUT VICRYL 0 UR6 27IN ABS (SUTURE) ×6 IMPLANT
TOWEL OR 17X26 10 PK STRL BLUE (TOWEL DISPOSABLE) ×2 IMPLANT
TOWEL OR NON WOVEN STRL DISP B (DISPOSABLE) ×2 IMPLANT
TRAY LAPAROSCOPIC (CUSTOM PROCEDURE TRAY) ×2 IMPLANT
TROCAR BLADELESS OPT 5 100 (ENDOMECHANICALS) ×2 IMPLANT
TROCAR XCEL BLUNT TIP 100MML (ENDOMECHANICALS) ×2 IMPLANT

## 2018-09-18 NOTE — Op Note (Addendum)
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  SURGEON:  Fenton Malling. Lucia Gaskins, MD  ASSISTANT:  No first assistant.  ANESTHESIA:  General endotracheal.  Anesthesiologist: Effie Berkshire, MD CRNA: Claudia Desanctis, CRNA  ASA:  2  ESTIMATED BLOOD LOSS:  Minimal.  DRAINS: none   SPECIMEN:   Appendix  COUNTS CORRECT:  YES  INDICATIONS FOR PROCEDURE: Carl Rivera is a 38 y.o. (DOB: 11/08/1980) white male whose primary care doctor is Copland, Gay Filler, MD and comes to the OR for an appendectomy.   The patient has had some vague right sided abdominal pain which began in Jan 2020.  He ahd a colonoscopy by Dr. Tarri Glenn on 07/31/2018 which suggested a "mucous secreting appendiceal lesion".  He comes for laparoscopic exploration and appendectomy.  I discussed with the patient, the indications and potential complications of appendiceal surgery.  The potential complications include, but are not limited to, bleeding, open surgery, bowel resection, and the possibility of another diagnosis.  OPERATIVE NOTE:  The patient underwent a general endotracheal anesthetic as supervised by Anesthesiologist: Effie Berkshire, MD CRNA: Claudia Desanctis, CRNA, General, in [place] room #1.  The patient was given 2 g of cefoxitin at the beginning of the procedure and the abdomen was prepped with ChloraPrep.   A time-out was held and surgical checklist run.  An infraumbilical incision was made with sharp dissection carried down to the abdominal cavity.  An 12 mm Hasson trocar was inserted through the infraumbilical incision and into the peritoneal cavity.  A 30 degree 5 mm laparoscope was  inserted through a 12 mm Hasson trocar and the Hasson trocar secured with a 0 Vicryl suture.  I placed a 5 mm trocar in the right upper quadrant and a 5 mm torcar in left lower quadrant and did abdominal exploration.    The right and left lobes of liver unremarkable.  Stomach was unremarkable.  The gall bladder had some fat around it, but was otherwise normal. The pelvic organs were unremarkable.  I saw no other intra-abdominal abnormality.  He had no hernia.  I ran the small bowel back from the terminal ileum several feet.  I saw no evidence of a Meckel's diverticula.  The appendix lay just above the right pelvic brim.  The appendix may have been minimally bulbous, but was not grossly abnormal.  I had to mobilize the terminal ileum off the right pelvic brim and mobilized the cecum medially.   The mesentery of the appendix was divided with a Harmonic scalpel.  I got to the base of the appendix.  I then used a two blue load 45 mm Ethicon Endo-GIA stapler and fired this across the base of the appendix.  I tried to get a cuff of cecum with the appendix.  I placed the appendix in Eco Sac bag and delivered the bag through the umbilical incision.  I did have to place a single suture of 2-0 vicryl across the appendiceal  artery because it kept oozing.   I irrigated the abdomen with 800 cc of saline.  After irrigating the abdomen, I then removed the trocars, in turn.  The umbilical port fascia was closed with 0 Vicryl suture.   I closed the skin each site with a 4-0 Monocryl suture and painted the wounds with DermaBond.  I then injected a total of 30 mL of 0.25% Marcaine at the incisions.  Sponge and needle count were correct at the end of the case.  The patient was transferred to the recovery room in good condition.  The patient tolerated the procedure well and it depends on the patient's post op clinical course as to when the patient could be discharged.   Left upper photo - left lobe of liver Right upper  photo - right lobe of liver Left lower photo - appendix Right lower photo - staple line on cecum from appendectomy   Photo - gall bladder (normal)  Alphonsa Overall, MD, [facility] Pager: [phone] Office phone:  [phone]

## 2018-09-18 NOTE — Anesthesia Postprocedure Evaluation (Signed)
Anesthesia Post Note  Patient: Carl Rivera  Procedure(s) Performed: APPENDECTOMY LAPAROSCOPIC (N/A Abdomen)     Patient location during evaluation: PACU Anesthesia Type: General Level of consciousness: awake and alert Pain management: pain level controlled Vital Signs Assessment: post-procedure vital signs reviewed and stable Respiratory status: spontaneous breathing, nonlabored ventilation, respiratory function stable and patient connected to nasal cannula oxygen Cardiovascular status: blood pressure returned to baseline and stable Postop Assessment: no apparent nausea or vomiting Anesthetic complications: no    Last Vitals:  Vitals:   09/18/18 1057 09/18/18 1208  BP: (!) 142/81 (!) 155/88  Pulse: 68 (!) 56  Resp: 16 16  Temp: 36.6 C 36.7 C  SpO2: 100% 100%                  Effie Berkshire

## 2018-09-18 NOTE — Transfer of Care (Signed)
Immediate Anesthesia Transfer of Care Note  Patient: Carl Rivera  Procedure(s) Performed: APPENDECTOMY LAPAROSCOPIC (N/A Abdomen)  Patient Location: PACU  Anesthesia Type:General  Level of Consciousness: drowsy  Airway & Oxygen Therapy: Patient Spontanous Breathing and Patient connected to face mask  Post-op Assessment: Report given to RN and Post -op Vital signs reviewed and stable  Post vital signs: Reviewed and stable  Last Vitals:  Vitals Value Taken Time  BP    Temp    Pulse    Resp    SpO2      Last Pain:  Vitals:   09/18/18 0545  TempSrc: Oral  PainSc: 4       Patients Stated Pain Goal: 4 (30/05/11 0211)  Complications: No apparent anesthesia complications

## 2018-09-18 NOTE — Anesthesia Procedure Notes (Signed)
Procedure Name: Intubation Date/Time: 09/18/2018 7:33 AM Performed by: Claudia Desanctis, CRNA Pre-anesthesia Checklist: Patient identified, Emergency Drugs available, Suction available and Patient being monitored Patient Re-evaluated:Patient Re-evaluated prior to induction Oxygen Delivery Method: Circle system utilized Preoxygenation: Pre-oxygenation with 100% oxygen Induction Type: IV induction Ventilation: Mask ventilation without difficulty Laryngoscope Size: 2 and Miller Grade View: Grade I Tube type: Oral Tube size: 7.5 mm Number of attempts: 1 Airway Equipment and Method: Stylet Placement Confirmation: ETT inserted through vocal cords under direct vision,  positive ETCO2 and breath sounds checked- equal and bilateral Secured at: 21 cm Tube secured with: Tape Dental Injury: Teeth and Oropharynx as per pre-operative assessment

## 2018-09-19 ENCOUNTER — Encounter (HOSPITAL_COMMUNITY): Payer: Self-pay | Admitting: Surgery

## 2018-09-19 DIAGNOSIS — R109 Unspecified abdominal pain: Secondary | ICD-10-CM | POA: Diagnosis not present

## 2018-09-19 DIAGNOSIS — K37 Unspecified appendicitis: Secondary | ICD-10-CM | POA: Diagnosis not present

## 2018-09-19 MED ORDER — TRAMADOL HCL 50 MG PO TABS: 50.0000 mg | ORAL_TABLET | Freq: Four times a day (QID) | ORAL | 1 refills | Status: DC | PRN

## 2018-09-19 NOTE — Discharge Instructions (Signed)
[facility] - DISCHARGE INSTRUCTIONS TO PATIENT  Activity:  Driving - May drive in 2 or 3 days, if doing well   Lifting - No lifting more than 15 pounds for 7 days, then no limit  Wound Care:   Leave the incision dry until tomorrow, then you may shower          No public water - pool, lake, etc - for 2 weeks  Diet:  As tolerated  Follow up appointment:  Call Dr. Pollie Friar office [facility]) at [phone] for an appointment in 2 to 3 weeks.  Medications and dosages:  Resume your home medications.  You have a prescription for:  Ultram  Call Dr. Lucia Gaskins or his office  [phone]) if you have:  Temperature greater than 100.4,  Persistent nausea and vomiting,  Severe uncontrolled pain,  Redness, tenderness, or signs of infection (pain, swelling, redness, odor or green/yellow discharge around the site),  Difficulty breathing, headache or visual disturbances,  Any other questions or concerns you may have after discharge.  In an emergency, call 911 or go to an Emergency Department at a nearby hospital.

## 2018-09-19 NOTE — Discharge Summary (Signed)
Physician Discharge Summary  Patient ID:  Carl Rivera  MRN: [id number]  DOB/AGE: [birth year] 37 y.o.  Admit date: 09/18/2018 Discharge date: 09/19/2018  Discharge Diagnoses:  1.  Abnormal appendix on colonoscopy 2.  Chronic right lower quadrant pain   Active Problems:   Right lower quadrant abdominal pain  Operation: Procedure(s):  APPENDECTOMY LAPAROSCOPIC on 09/18/2018 - D. Lucia Gaskins  Discharged Condition: good  Hospital Course: Carl Rivera is an 38 y.o. male whose primary care physician is Copland, Gay Filler, MD and who was admitted 09/18/2018 with a chief complaint of right lower quadrant abdominal pain and an abnormal appendix on colonoscopy.   He was brought to the operating room on 09/18/2018 and underwent laparoscopic appendectomy .  On the first post op day he was doing well, taking po's, and ready for discharge.  The discharge instructions were reviewed with the patient.  Consults: None  Significant Diagnostic Studies: Results for orders placed or performed during the hospital encounter of 09/16/18  Surgical pcr screen   Specimen: Nasal Mucosa; Nasal Swab  Result Value Ref Range   MRSA, PCR NEGATIVE NEGATIVE   Staphylococcus aureus NEGATIVE NEGATIVE  CBC WITH DIFFERENTIAL  Result Value Ref Range   WBC 6.2 4.0 - 10.5 K/uL   RBC 5.42 4.22 - 5.81 MIL/uL   Hemoglobin 15.7 13.0 - 17.0 g/dL   HCT 47.1 39.0 - 52.0 %   MCV 86.9 80.0 - 100.0 fL   MCH 29.0 26.0 - 34.0 pg   MCHC 33.3 30.0 - 36.0 g/dL   RDW 12.4 11.5 - 15.5 %   Platelets 167 150 - 400 K/uL   nRBC 0.0 0.0 - 0.2 %   Neutrophils Relative % 48 %   Neutro Abs 3.0 1.7 - 7.7 K/uL   Lymphocytes Relative 41 %   Lymphs Abs 2.5 0.7 - 4.0 K/uL   Monocytes Relative 7 %   Monocytes Absolute 0.4 0.1 - 1.0 K/uL   Eosinophils Relative 3 %   Eosinophils Absolute 0.2 0.0 - 0.5 K/uL   Basophils Relative 0 %   Basophils Absolute 0.0 0.0 - 0.1 K/uL   Immature Granulocytes 1 %   Abs Immature Granulocytes 0.03 0.00 - 0.07 K/uL     Nm Hepato W/eject Fract  Result Date: 09/01/2018 CLINICAL DATA:  Abdominal pain EXAM: NUCLEAR MEDICINE HEPATOBILIARY IMAGING WITH GALLBLADDER EF VIEWS: Anterior right upper quadrant RADIOPHARMACEUTICALS:  4.95 mCi Tc-14m  Choletec IV COMPARISON:  None. FINDINGS: Liver uptake of radiotracer is unremarkable. There is prompt visualization of gallbladder and small bowel, indicating patency of the cystic and common bile ducts. Patient consumed 8 ounces of Ensure orally with calculation of the computer generated ejection fraction of radiotracer from the gallbladder. Patient did not experience clinical symptoms with the oral Ensure consumption. The computer generated ejection fraction of radiotracer from the gallbladder is normal at 44%, normal greater than 33% using the oral agent. IMPRESSION: Study within normal limits. Electronically Signed   By: Lowella Grip III M.D.   On: 09/01/2018 11:02   US Abdomen Limited Ruq  Result Date: 09/01/2018 CLINICAL DATA:  Right upper quadrant abdominal pain. EXAM: ULTRASOUND ABDOMEN LIMITED RIGHT UPPER QUADRANT COMPARISON:  CT scan of April 24, 2018. FINDINGS: Gallbladder: No gallstones or wall thickening visualized. No sonographic Murphy sign noted by sonographer. Common bile duct: Diameter: 3 mm which is within normal limits. Liver: No focal lesion identified. Within normal limits in parenchymal echogenicity. Portal vein is patent on color Doppler imaging with normal direction of  blood flow towards the liver. IMPRESSION: No definite abnormality seen in the right upper quadrant of the abdomen. Electronically Signed   By: Marijo Conception M.D.   On: 09/01/2018 07:42    Discharge Exam:  Vitals:   09/19/18 0149 09/19/18 0621  BP: 122/76 124/73  Pulse: 63 60  Resp: 18 18  Temp: 97.8 F (36.6 C) 98 F (36.7 C)  SpO2: 97% 96%    General: WN WM who is alert and generally healthy appearing.  Lungs: Clear to auscultation and symmetric breath sounds. Heart:   RRR. No murmur or rub. Abdomen: Soft. No mass. Normal bowel sounds.  His incisions look good.  Discharge Medications:   Allergies as of 09/19/2018      Reactions   Other    Tree Nut, scratchy throat, malaise      Medication List    TAKE these medications   HYDROcodone-acetaminophen 5-325 MG tablet Commonly known as: NORCO/VICODIN Take 1-2 tablets by mouth every 6 (six) hours as needed.   traMADol 50 MG tablet Commonly known as: ULTRAM Take 1 tablet (50 mg total) by mouth every 6 (six) hours as needed.       Disposition: Discharge disposition: 01-Home or Self Care       Discharge Instructions    Diet - low sodium heart healthy   Complete by: As directed    Increase activity slowly   Complete by: As directed          Signed: Alphonsa Overall, M.D., [facility] Surgery Office:  [phone]  09/19/2018, 8:42 AM

## 2018-09-19 NOTE — Progress Notes (Signed)
Discharge instructions given to patient. Patient had no questions. NT or writer will wheel patient out once family comes in  

## 2018-10-14 ENCOUNTER — Other Ambulatory Visit (HOSPITAL_COMMUNITY): Payer: Federal, State, Local not specified - PPO

## 2018-12-25 ENCOUNTER — Telehealth: Payer: Self-pay

## 2018-12-25 NOTE — Telephone Encounter (Signed)
CRM was sent to [place] in error. I re-routed this as a telephone encounter to the pt provider.

## 2019-01-25 NOTE — Progress Notes (Addendum)
[place] at [facility] [address] [place], [place] [address] [phone]  Date:  01/26/2019   Name:  Carl Rivera   DOB:  03-05-1981   MRN:  [id number]  PCP:  Darreld Mclean, MD    Chief Complaint: Annual Exam (covid antibody test)   History of Present Illness:  Carl Rivera is a 38 y.o. very pleasant male patient who presents with the following:  Generally healthy man here today for physical exam Last seen by myself in February of this year He was having right lower quadrant pain and I was concerned about possible appendicitis.  He went to the ER for evaluation, CT scan was negative at that time He then saw gastroenterology, was noted to have a possible mucocele of the appendix on colonoscopy in May.  As he continued to have abdominal pain, he did end up undergoing an elective appendectomy in July of this year He is feeling much better now, his sx are resolved.  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 He felt like it was a true vaccine as he had side effects from the shot, would like to do an antibody titer today Tetanus is up-to-date Needs lipid panel today  There is a family history of colon cancer No family history of prostate cancer He notes he has cherry angiomas on his trunk that are multiplying . He would like to see derm- some of them are itchy   No CP or SOB Mood is good  BP Readings from Last 3 Encounters:  01/26/19 126/80  09/19/18 124/73  09/16/18 123/88   Wt Readings from Last 3 Encounters:  01/26/19 237 lb (107.5 kg)  09/18/18 240 lb (108.9 kg)  09/16/18 240  lb (108.9 kg)    Patient Active Problem List   Diagnosis Date Noted  . Right lower quadrant abdominal pain 09/18/2018  . Overweight 12/24/2016  . Benign neoplasm of colon 08/05/2013  . Family history of malignant neoplasm of gastrointestinal tract 08/05/2013    Past Medical History:  Diagnosis Date  . Family history of colon cancer   . Localized superficial swelling, mass, or lump    pt unsure what this is.    Past Surgical History:  Procedure Laterality Date  . COLONOSCOPY    . LAPAROSCOPIC APPENDECTOMY N/A 09/18/2018   Procedure: APPENDECTOMY LAPAROSCOPIC;  Surgeon: Alphonsa Overall, MD;  Location: [facility];  Service: General;  Laterality: N/A;  . LIPOMA EXCISION     x2/ on side abdomen  . TOE SURGERY  2011   Bil pinkey toe surgery/ removed the bones    Social History   Tobacco Use  . Smoking status: Never Smoker  . Smokeless tobacco: Never Used  Substance Use Topics  . Alcohol use: Yes    Comment: occasional/ twice a month  . Drug use: Never    Family History  Problem Relation Age of Onset  . Familial polyposis Father        multiple polyps (37-60) ; unknown pathology; no cancer  . Cancer Paternal  Grandmother 31       colon cancer; breast cancer in 76s  . Cancer Paternal Uncle 37       colorectal cancer  . Familial polyposis Paternal Uncle 103       polyp history; unknown type or number  . Cancer Other 60       1st cousin once removed (pat grandmother's sister's son) with colorectal cancer  . Colon polyps Brother   . ALS Maternal Grandfather     No Active Allergies  Medication list has been reviewed and updated.  No current outpatient medications on file prior to visit.   No current facility-administered medications on file prior to visit.     Review of Systems:  As per HPI- otherwise negative.  No fever or chills No concerns about alcohol use Physical Examination: Vitals:   01/26/19 0821  BP: 126/80  Pulse: 63  Resp: 16  Temp: (!) 96.8 F (36  C)  SpO2: 97%   Vitals:   01/26/19 0821  Weight: 237 lb (107.5 kg)  Height: 6\' 1"  (1.854 m)   Body mass index is 31.27 kg/m. Ideal Body Weight: Weight in (lb) to have BMI = 25: 189.1  GEN: WDWN, NAD, Non-toxic, A & O x 3, mild overweigh, looks well  HEENT: Atraumatic, Normocephalic. Neck supple. No masses, No LAD.  TM wnl  Ears and Nose: No external deformity. CV: RRR, No M/G/R. No JVD. No thrill. No extra heart sounds. PULM: CTA B, no wheezes, crackles, rhonchi. No retractions. No resp. distress. No accessory muscle use. ABD: S, NT, ND, +BS. No rebound. No HSM. EXTR: No c/c/e NEURO Normal gait.  PSYCH: Normally interactive. Conversant. Not depressed or anxious appearing.  Calm demeanor.  He has multiple cherry angiomas on his trunk  Assessment and Plan: Physical exam  Encounter for screening laboratory testing for COVID-19 virus - Plan: SAR CoV2 Serology (COVID 19)AB(IGG)IA  Cherry angioma - Plan: Ambulatory referral to Dermatology  Screening for hyperlipidemia - Plan: Lipid panel  CPE today Overall doing well Flu shot Derm referral Lipid panel pending, covid antibody pending Will plan further follow- up pending labs.  This visit occurred during the SARS-CoV-2 public health emergency.  Safety protocols were in place, including screening questions prior to the visit, additional usage of staff PPE, and extensive cleaning of exam room while observing appropriate contact time as indicated for disinfecting solutions.      Signed Lamar Blinks, MD  Received chl panel, message to pt  Results for orders placed or performed in visit on 01/26/19  Lipid panel  Result Value Ref Range   Cholesterol 210 (H) 0 - 200 mg/dL   Triglycerides 101.0 0.0 - 149.0 mg/dL   HDL 47.60 >39.00 mg/dL   VLDL 20.2 0.0 - 40.0 mg/dL   LDL Cholesterol 142 (H) 0 - 99 mg/dL   Total CHOL/HDL Ratio 4    NonHDL 161.94

## 2019-01-25 NOTE — Patient Instructions (Addendum)
It was nice to see you again today, I will be in touch with your labs ASAP We will set you up to see dermatology about your cherry angiomas- these are benign, but can be treated with a laser if you like  Take care!     Health Maintenance, Male Adopting a healthy lifestyle and getting preventive care are important in promoting health and wellness. Ask your health care provider about:  The right schedule for you to have regular tests and exams.  Things you can do on your own to prevent diseases and keep yourself healthy. What should I know about diet, weight, and exercise? Eat a healthy diet   Eat a diet that includes plenty of vegetables, fruits, low-fat dairy products, and lean protein.  Do not eat a lot of foods that are high in solid fats, added sugars, or sodium. Maintain a healthy weight Body mass index (BMI) is a measurement that can be used to identify possible weight problems. It estimates body fat based on height and weight. Your health care provider can help determine your BMI and help you achieve or maintain a healthy weight. Get regular exercise Get regular exercise. This is one of the most important things you can do for your health. Most adults should:  Exercise for at least 150 minutes each week. The exercise should increase your heart rate and make you sweat (moderate-intensity exercise).  Do strengthening exercises at least twice a week. This is in addition to the moderate-intensity exercise.  Spend less time sitting. Even light physical activity can be beneficial. Watch cholesterol and blood lipids Have your blood tested for lipids and cholesterol at 38 years of age, then have this test every 5 years. You may need to have your cholesterol levels checked more often if:  Your lipid or cholesterol levels are high.  You are older than 37 years of age.  You are at high risk for heart disease. What should I know about cancer screening? Many types of cancers can be  detected early and may often be prevented. Depending on your health history and family history, you may need to have cancer screening at various ages. This may include screening for:  Colorectal cancer.  Prostate cancer.  Skin cancer.  Lung cancer. What should I know about heart disease, diabetes, and high blood pressure? Blood pressure and heart disease  High blood pressure causes heart disease and increases the risk of stroke. This is more likely to develop in people who have high blood pressure readings, are of African descent, or are overweight.  Talk with your health care provider about your target blood pressure readings.  Have your blood pressure checked: ? Every 3-5 years if you are 95-65 years of age. ? Every year if you are 18 years old or older.  If you are between the ages of 41 and 32 and are a current or former smoker, ask your health care provider if you should have a one-time screening for abdominal aortic aneurysm (AAA). Diabetes Have regular diabetes screenings. This checks your fasting blood sugar level. Have the screening done:  Once every three years after age 21 if you are at a normal weight and have a low risk for diabetes.  More often and at a younger age if you are overweight or have a high risk for diabetes. What should I know about preventing infection? Hepatitis B If you have a higher risk for hepatitis B, you should be screened for this virus. Talk with your  health care provider to find out if you are at risk for hepatitis B infection. Hepatitis C Blood testing is recommended for:  Everyone born from [birth year] through 1964.  Anyone with known risk factors for hepatitis C. Sexually transmitted infections (STIs)  You should be screened each year for STIs, including gonorrhea and chlamydia, if: ? You are sexually active and are younger than 37 years of age. ? You are older than 37 years of age and your health care provider tells you that you are at risk  for this type of infection. ? Your sexual activity has changed since you were last screened, and you are at increased risk for chlamydia or gonorrhea. Ask your health care provider if you are at risk.  Ask your health care provider about whether you are at high risk for HIV. Your health care provider may recommend a prescription medicine to help prevent HIV infection. If you choose to take medicine to prevent HIV, you should first get tested for HIV. You should then be tested every 3 months for as long as you are taking the medicine. Follow these instructions at home: Lifestyle  Do not use any products that contain nicotine or tobacco, such as cigarettes, e-cigarettes, and chewing tobacco. If you need help quitting, ask your health care provider.  Do not use street drugs.  Do not share needles.  Ask your health care provider for help if you need support or information about quitting drugs. Alcohol use  Do not drink alcohol if your health care provider tells you not to drink.  If you drink alcohol: ? Limit how much you have to 0-2 drinks a day. ? Be aware of how much alcohol is in your drink. In the [place] one drink equals one 12 oz bottle of beer (355 mL), one 5 oz glass of wine (148 mL), or one 1 oz glass of hard liquor (44 mL). General instructions  Schedule regular health, dental, and eye exams.  Stay current with your vaccines.  Tell your health care provider if: ? You often feel depressed. ? You have ever been abused or do not feel safe at home. Summary  Adopting a healthy lifestyle and getting preventive care are important in promoting health and wellness.  Follow your health care provider's instructions about healthy diet, exercising, and getting tested or screened for diseases.  Follow your health care provider's instructions on monitoring your cholesterol and blood pressure. This information is not intended to replace advice given to you by your health care provider.  Make sure you discuss any questions you have with your health care provider. Document Released: 08/18/2007 Document Revised: 02/12/2018 Document Reviewed: 02/12/2018 Elsevier Patient Education  2020 [employer].

## 2019-01-26 ENCOUNTER — Encounter: Payer: Self-pay | Admitting: Family Medicine

## 2019-01-26 ENCOUNTER — Other Ambulatory Visit: Payer: Self-pay

## 2019-01-26 ENCOUNTER — Ambulatory Visit (INDEPENDENT_AMBULATORY_CARE_PROVIDER_SITE_OTHER): Payer: Federal, State, Local not specified - PPO | Admitting: Family Medicine

## 2019-01-26 VITALS — BP 126/80 | HR 63 | Temp 96.8°F | Resp 16 | Ht 73.0 in | Wt 237.0 lb

## 2019-01-26 DIAGNOSIS — Z23 Encounter for immunization: Secondary | ICD-10-CM

## 2019-01-26 DIAGNOSIS — Z20822 Contact with and (suspected) exposure to covid-19: Secondary | ICD-10-CM

## 2019-01-26 DIAGNOSIS — Z13220 Encounter for screening for lipoid disorders: Secondary | ICD-10-CM

## 2019-01-26 DIAGNOSIS — Z20828 Contact with and (suspected) exposure to other viral communicable diseases: Secondary | ICD-10-CM

## 2019-01-26 DIAGNOSIS — D1801 Hemangioma of skin and subcutaneous tissue: Secondary | ICD-10-CM

## 2019-01-26 DIAGNOSIS — Z0000 Encounter for general adult medical examination without abnormal findings: Secondary | ICD-10-CM

## 2019-01-26 LAB — LIPID PANEL
Cholesterol: 210 mg/dL — ABNORMAL HIGH (ref 0–200)
HDL: 47.6 mg/dL (ref >39.00)
LDL Cholesterol: 142 mg/dL — ABNORMAL HIGH (ref 0–99)
NonHDL: 161.94
Total CHOL/HDL Ratio: 4
Triglycerides: 101 mg/dL (ref 0.0–149.0)
VLDL: 20.2 mg/dL (ref 0.0–40.0)

## 2019-01-27 LAB — SAR COV2 SEROLOGY (COVID19)AB(IGG),IA: SARS CoV2 AB IGG: POSITIVE — AB

## 2019-02-13 ENCOUNTER — Encounter: Payer: Self-pay | Admitting: Family Medicine

## 2019-02-13 DIAGNOSIS — D1801 Hemangioma of skin and subcutaneous tissue: Secondary | ICD-10-CM | POA: Diagnosis not present

## 2019-02-13 DIAGNOSIS — L918 Other hypertrophic disorders of the skin: Secondary | ICD-10-CM | POA: Diagnosis not present

## 2019-02-13 DIAGNOSIS — L919 Hypertrophic disorder of the skin, unspecified: Secondary | ICD-10-CM | POA: Diagnosis not present

## 2019-02-13 DIAGNOSIS — D224 Melanocytic nevi of scalp and neck: Secondary | ICD-10-CM | POA: Diagnosis not present

## 2019-04-21 ENCOUNTER — Ambulatory Visit: Payer: Federal, State, Local not specified - PPO | Attending: Internal Medicine

## 2019-04-21 DIAGNOSIS — Z20822 Contact with and (suspected) exposure to covid-19: Secondary | ICD-10-CM | POA: Diagnosis not present

## 2019-04-22 LAB — NOVEL CORONAVIRUS, NAA: SARS-CoV-2, NAA: NOT DETECTED

## 2019-04-29 ENCOUNTER — Ambulatory Visit: Payer: Federal, State, Local not specified - PPO | Attending: Internal Medicine

## 2019-04-29 DIAGNOSIS — Z20822 Contact with and (suspected) exposure to covid-19: Secondary | ICD-10-CM | POA: Diagnosis not present

## 2019-04-30 LAB — NOVEL CORONAVIRUS, NAA: SARS-CoV-2, NAA: NOT DETECTED

## 2019-05-15 ENCOUNTER — Ambulatory Visit (INDEPENDENT_AMBULATORY_CARE_PROVIDER_SITE_OTHER): Payer: Federal, State, Local not specified - PPO | Admitting: Internal Medicine

## 2019-05-15 ENCOUNTER — Other Ambulatory Visit: Payer: Self-pay

## 2019-05-15 VITALS — Temp 97.5°F

## 2019-05-15 DIAGNOSIS — J019 Acute sinusitis, unspecified: Secondary | ICD-10-CM | POA: Diagnosis not present

## 2019-05-15 MED ORDER — AMOXICILLIN-POT CLAVULANATE 875-125 MG PO TABS: 1.0000 | ORAL_TABLET | Freq: Two times a day (BID) | ORAL | 0 refills | Status: DC

## 2019-05-15 NOTE — Progress Notes (Signed)
   Subjective:    Patient ID: Carl Rivera, male    DOB: 24-Jul-1980, 39 y.o.   MRN: [id number]  DOS:  05/15/2019 Type of visit - description: Virtual Visit via Video Note  I connected with the above patient  by a video enabled telemedicine application and verified that I am speaking with the correct person using two identifiers.   THIS ENCOUNTER IS A VIRTUAL VISIT DUE TO COVID-19 - PATIENT WAS NOT SEEN IN THE OFFICE. PATIENT HAS CONSENTED TO VIRTUAL VISIT / TELEMEDICINE VISIT   Location of patient: home  Location of provider: office  I discussed the limitations of evaluation and management by telemedicine and the availability of in person appointments. The patient expressed understanding and agreed to proceed.  Acute Symptoms started approximately 18 days ago with what seems to be a mild cold with sinus congestion, mild cough. Symptoms were protracted but gradually better until 3 days ago with he developed increased sinus pain, congestion. The previously clear nasal discharge become yellow and green. He feels he has a sinus infection.  Reports he participated on [employer] vaccination trial and has been fully vaccinated since August 2021, multiple test for COVID-19 have been negative including a recent one.  Review of Systems Denies fever chills No nausea vomiting No myalgias or rash No chest pain no difficulty breathing  Past Medical History:  Diagnosis Date  . Family history of colon cancer   . Localized superficial swelling, mass, or lump    pt unsure what this is.    Past Surgical History:  Procedure Laterality Date  . COLONOSCOPY    . LAPAROSCOPIC APPENDECTOMY N/A 09/18/2018   Procedure: APPENDECTOMY LAPAROSCOPIC;  Surgeon: Alphonsa Overall, MD;  Location: [facility];  Service: General;  Laterality: N/A;  . LIPOMA EXCISION     x2/ on side abdomen  . TOE SURGERY  2011   Bil pinkey toe surgery/ removed the bones    Allergies as of 05/15/2019   No Active Allergies       Medication List    as of May 15, 2019  3:43 PM   You have not been prescribed any medications.        Objective:   Physical Exam Temp (!) 97.5 F (36.4 C) (Temporal)  This is a virtual video visit, he is alert oriented x3, in no apparent distress.  Face symmetric, nontoxic-appearing    Assessment    39 year old male, BMI 31, healthy.  Presents with:  Acute sinusitis: Started with mild upper respiratory symptoms approximately 18 days ago, acutely worse for 2 to 3 days, green nasal discharge and sinus pressure, he knows the limitation of a virtual visit but I think he has sinusitis, I agree with the patient. Plan: Augmentin, probiotics to prevent diarrhea. Flonase, saline irrigation (has a Nettie pot) Mucinex, okay to use small amount of Sudafed behind the counter. Call if not gradually better. He verbalized understanding    I discussed the assessment and treatment plan with the patient. The patient was provided an opportunity to ask questions and all were answered. The patient agreed with the plan and demonstrated an understanding of the instructions.   The patient was advised to call back or seek an in-person evaluation if the symptoms worsen or if the condition fails to improve as anticipated.

## 2019-08-11 IMAGING — NM NUCLEAR MEDICINE HEPATOBILIARY IMAGING WITH GALLBLADDER EF
1 series · 6 of 6 positions shown · non-contrast
Comparison: None.

CLINICAL DATA: Abdominal pain

EXAM:
NUCLEAR MEDICINE HEPATOBILIARY IMAGING WITH GALLBLADDER EF
VIEWS:
Anterior right upper quadrant
RADIOPHARMACEUTICALS:  4.95 mCi 7c-WWm  Choletec IV

[Series 1: raw data · 4.46mm/px · 6 of 60 frames shown]
[frame 6/60]
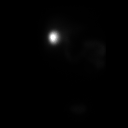
[frame 16/60]
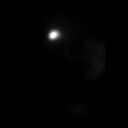
[frame 26/60]
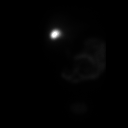
[frame 36/60]
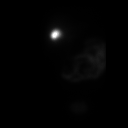
[frame 46/60]
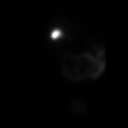
[frame 56/60]
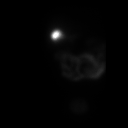

[6 of 6 positions shown; findings below may reference images not displayed]

FINDINGS: Liver uptake of radiotracer is unremarkable. There is prompt
visualization of gallbladder and small bowel, indicating patency of
the cystic and common bile ducts. Patient consumed 8 ounces of
Ensure orally with calculation of the computer generated ejection
fraction of radiotracer from the gallbladder. Patient did not
experience clinical symptoms with the oral Ensure consumption. The
computer generated ejection fraction of radiotracer from the
gallbladder is normal at 44%, normal greater than 33% using the oral
agent.
IMPRESSION: Study within normal limits.

## 2019-08-11 IMAGING — US ULTRASOUND ABDOMEN LIMITED
1 series · 14 of 25 positions shown · non-contrast
Comparison: CT scan of April 24, 2018.

CLINICAL DATA: Right upper quadrant abdominal pain.

EXAM:
ULTRASOUND ABDOMEN LIMITED RIGHT UPPER QUADRANT

[Series 1: ultrasound abdomen limited · 14 of 27 slices shown]
[im 1/27]
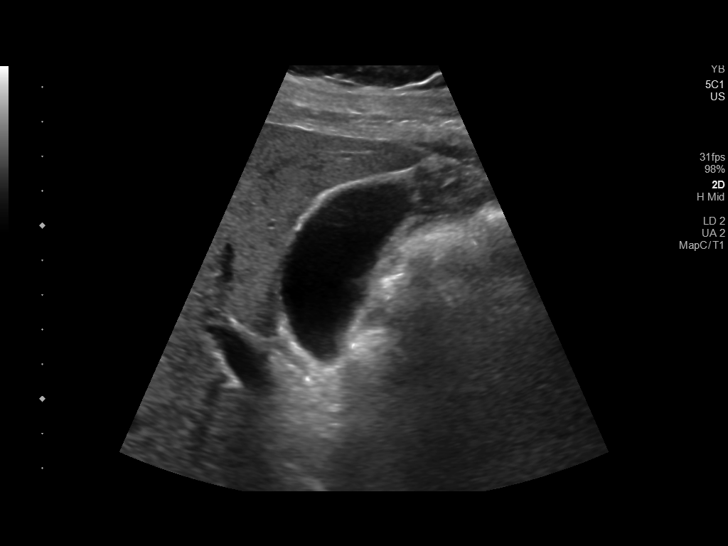
[im 3/27]
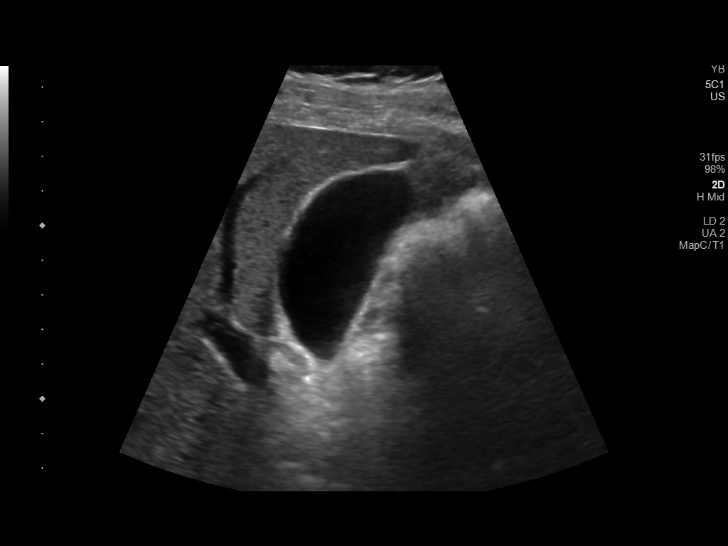
[im 5/27]
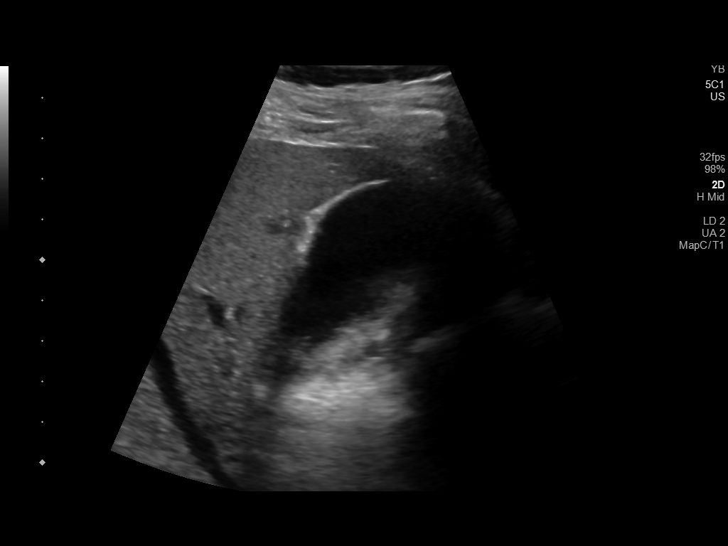
[im 7/27]
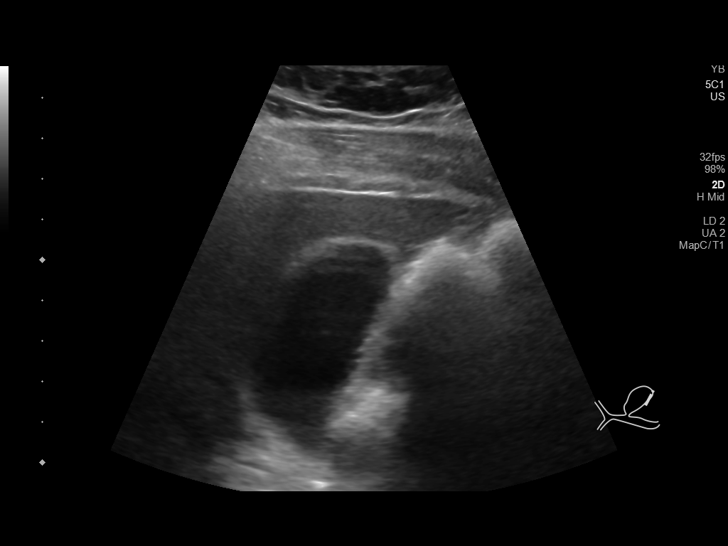
[im 9/27]
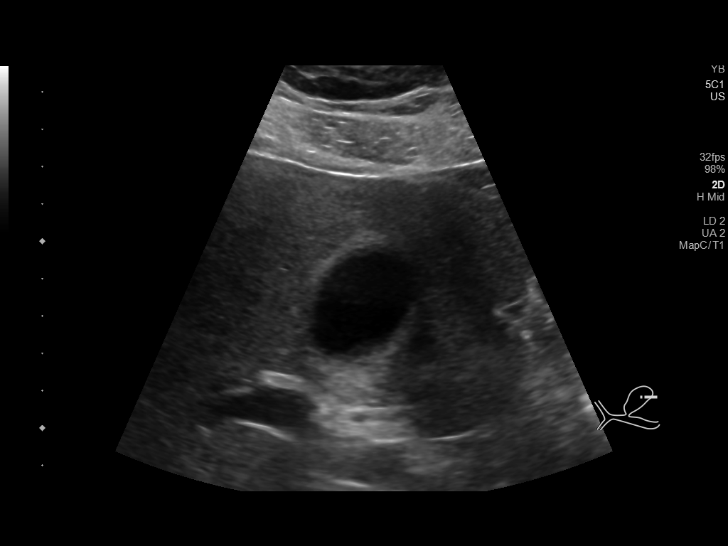
[im 10/27]
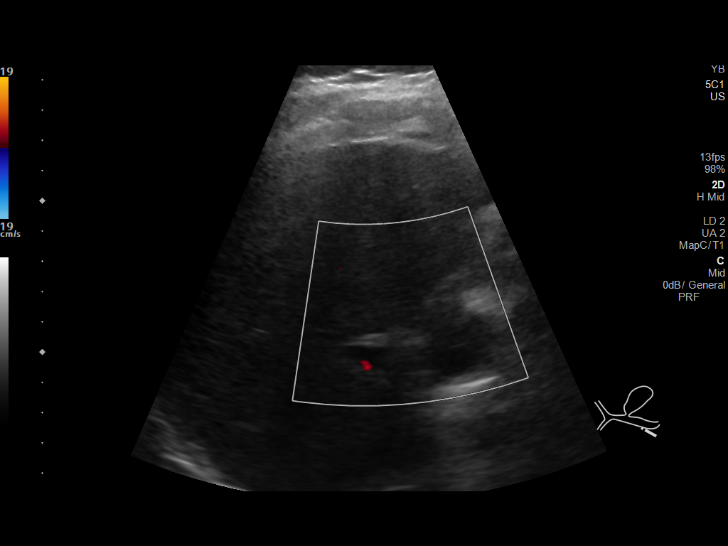
[im 12/27]
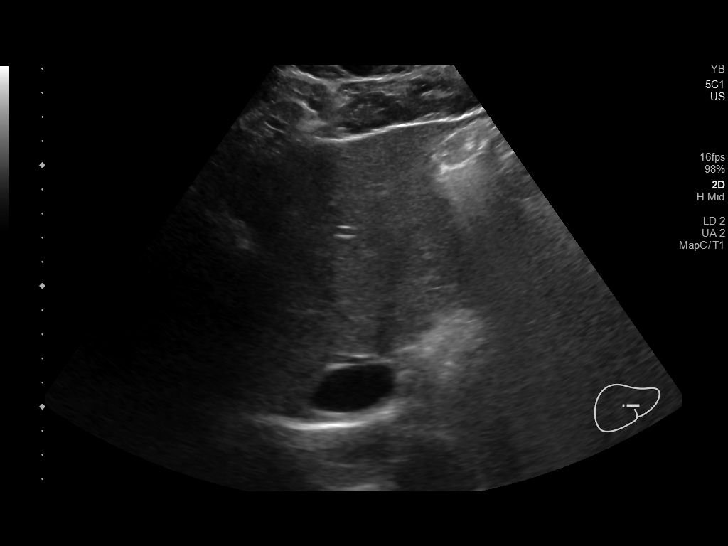
[im 15/27]
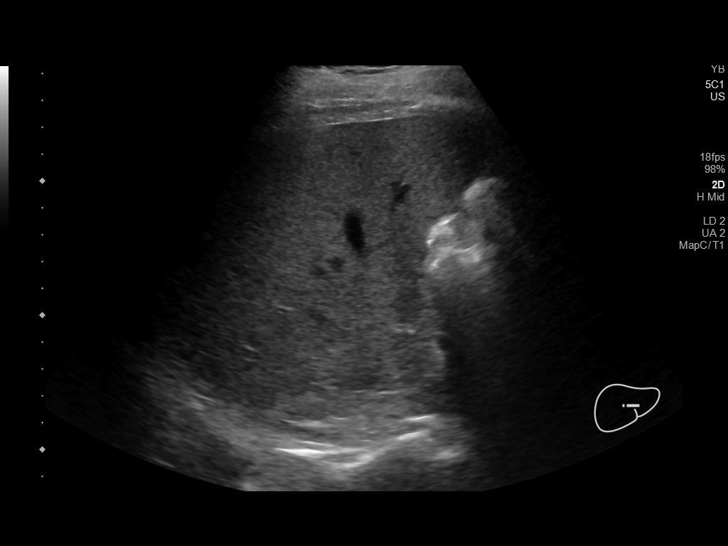
[im 17/27]
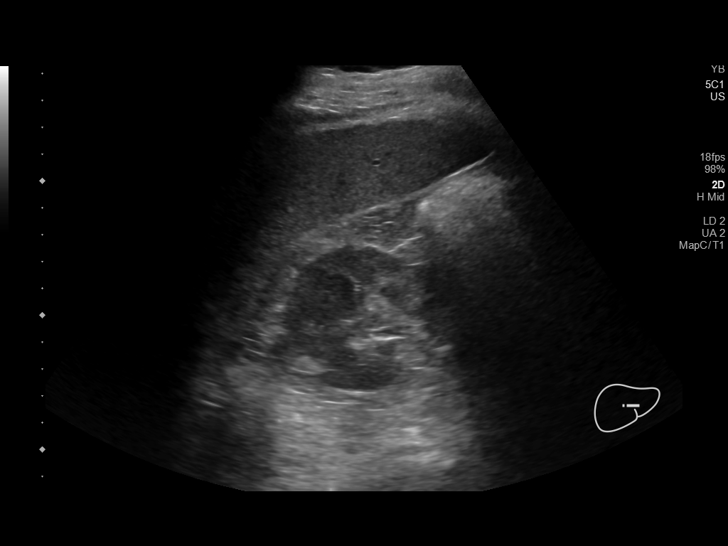
[im 18/27]
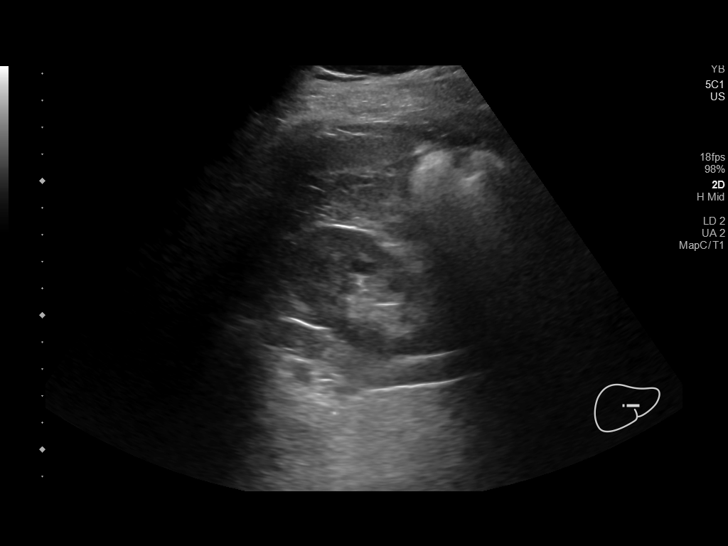
[im 20/27]
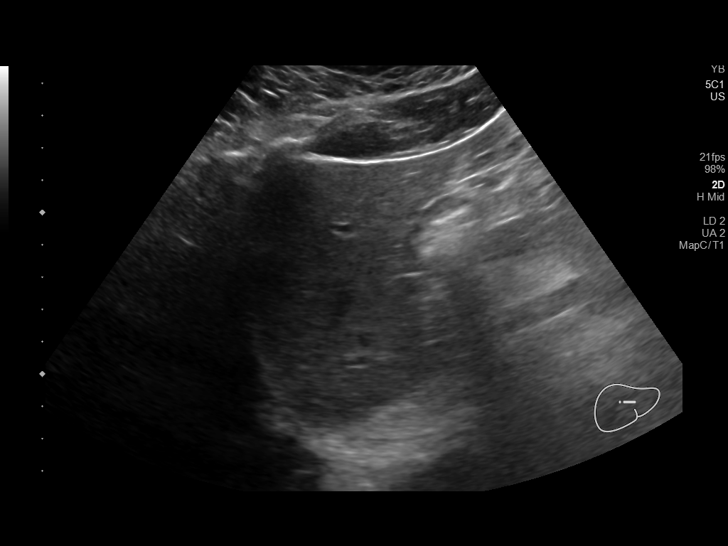
[im 22/27]
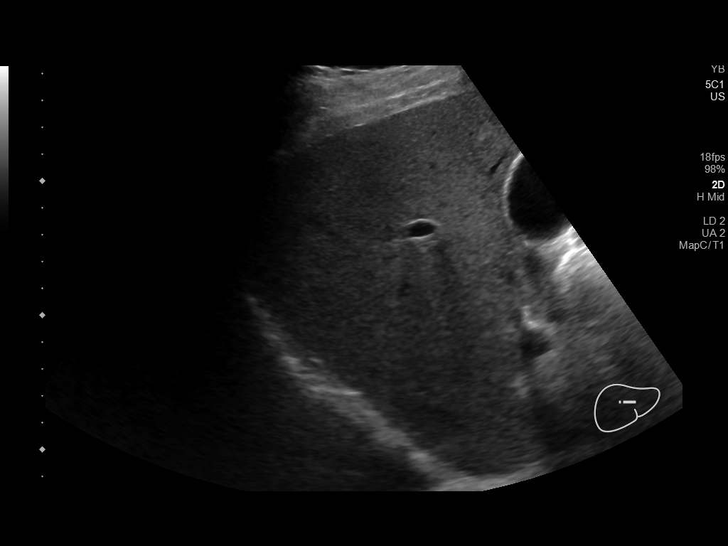
[im 24/27]
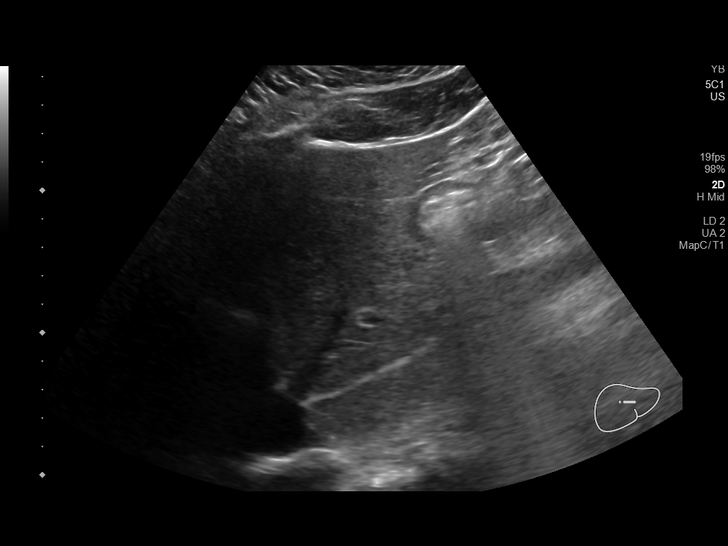
[im 27/27]
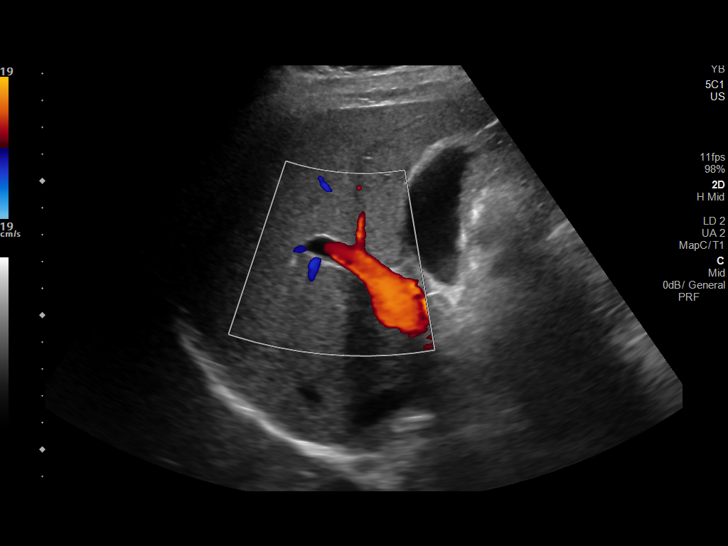

[14 of 25 positions shown; findings below may reference images not displayed]

FINDINGS: Gallbladder:

No gallstones or wall thickening visualized. No sonographic Murphy
sign noted by sonographer.

Common bile duct:

Diameter: 3 mm which is within normal limits.

Liver:

No focal lesion identified. Within normal limits in parenchymal
echogenicity. Portal vein is patent on color Doppler imaging with
normal direction of blood flow towards the liver.
IMPRESSION: No definite abnormality seen in the right upper quadrant of the
abdomen.

## 2019-08-21 DIAGNOSIS — L858 Other specified epidermal thickening: Secondary | ICD-10-CM | POA: Diagnosis not present

## 2019-08-21 DIAGNOSIS — D2339 Other benign neoplasm of skin of other parts of face: Secondary | ICD-10-CM | POA: Diagnosis not present

## 2019-08-21 DIAGNOSIS — L820 Inflamed seborrheic keratosis: Secondary | ICD-10-CM | POA: Diagnosis not present

## 2019-11-23 ENCOUNTER — Ambulatory Visit: Payer: Federal, State, Local not specified - PPO | Admitting: Family Medicine

## 2019-11-24 NOTE — Progress Notes (Addendum)
[place] at [facility] [address], [place], [place] [address] [phone]  Date:  11/26/2019   Name:  Carl Rivera   DOB:  1980/03/14   MRN:  [id number]  PCP:  Darreld Mclean, MD    Chief Complaint: Fatigue (stresses at work and with children) and Flu Vaccine   History of Present Illness:  Carl Rivera is a 39 y.o. very pleasant male patient who presents with the following:  Patient here today for follow-up visit Last seen by myself for a physical in November of last year He saw my partner Dr. Larose Kells this spring with concern of sinus infection  He works as a Photographer.  Married with 4 young children He participated in a COVID-19 vaccine trial- he is waiting on appt for his booster dose   Flu vaccine-  Given today  Can offer routine lab work today He had a colonoscopy performed last year Most recent routine labs from 2020  Today pt notes that he is really busy and has a whole lot going on.   Recently he has noted a bit more difficulty with concentrating on tasks He specifically states he generally drives well over the speed limit but recently he has found himself driving under the speed limit, like he feels nervous to drive as fast as he normally does His 39yo tested highly and has started in K early.  This is a stress for him and his wife  He notes that 2-3 weeks ago he felt nauseated and actually vomited.   This is unusual for him. Has not persisted   He may feel chest pain - mostly when seated- such as when he is driving or in a meeting.  Often if he changes his posture it will get better. This has been going on for perhaps 2-3 months, or a bit longer This does NOT occur with exercise.  He has been running some for exercise but less the last month No history of CAD himself, he does not smoke He may overeat in times of stress His household and his job are quite busy.  He does not feel depressed however and  does not wish to start any medication for depression at this time.  No SI The CP may last between 5 and 15 minutes, pressing on the area may cause it to go away.  Noted earlier today but not current   His grandfather had an MI in his early 27s but he was not in good health generally   Patient Active Problem List   Diagnosis Date Noted  . Right lower quadrant abdominal pain 09/18/2018  . Overweight 12/24/2016  . Benign neoplasm of colon 08/05/2013  . Family history of malignant neoplasm of gastrointestinal tract 08/05/2013    Past Medical History:  Diagnosis Date  . Family history of colon cancer   . Localized superficial swelling, mass, or lump    pt unsure what this is.    Past Surgical History:  Procedure Laterality Date  . COLONOSCOPY    . LAPAROSCOPIC APPENDECTOMY N/A 09/18/2018   Procedure: APPENDECTOMY LAPAROSCOPIC;  Surgeon: Alphonsa Overall, MD;  Location: [facility];  Service: General;  Laterality: N/A;  . LIPOMA EXCISION     x2/ on side abdomen  . TOE SURGERY  2011   Bil pinkey toe surgery/ removed the bones    Social History   Tobacco Use  . Smoking status: Never Smoker  .  Smokeless tobacco: Never Used  Vaping Use  . Vaping Use: Never used  Substance Use Topics  . Alcohol use: Yes    Comment: occasional/ twice a month  . Drug use: Never    Family History  Problem Relation Age of Onset  . Familial polyposis Father        multiple polyps (36-60) ; unknown pathology; no cancer  . Cancer Paternal Grandmother 23       colon cancer; breast cancer in 75s  . Cancer Paternal Uncle 41       colorectal cancer  . Familial polyposis Paternal Uncle 1       polyp history; unknown type or number  . Cancer Other 29       1st cousin once removed (pat grandmother's sister's son) with colorectal cancer  . Colon polyps Brother   . ALS Maternal Grandfather     No Active Allergies  Medication list has been reviewed and updated.  No current outpatient medications on file  prior to visit.   No current facility-administered medications on file prior to visit.    Review of Systems:  As per HPI- otherwise negative.   Physical Examination: Vitals:   11/26/19 0946  BP: 122/80  Pulse: 75  Resp: 16  SpO2: 95%   Vitals:   11/26/19 0946  Weight: 236 lb (107 kg)  Height: 6\' 1"  (1.854 m)   Body mass index is 31.14 kg/m. Ideal Body Weight: Weight in (lb) to have BMI = 25: 189.1  GEN: no acute distress.  Tall build, mild overweight, looks well  HEENT: Atraumatic, Normocephalic.   Bilateral TM wnl, oropharynx normal.  PEERL,EOMI.   Ears and Nose: No external deformity. CV: RRR, No M/G/R. No JVD. No thrill. No extra heart sounds. PULM: CTA B, no wheezes, crackles, rhonchi. No retractions. No resp. distress. No accessory muscle use. ABD: S, NT, ND, +BS. No rebound. No HSM. EXTR: No c/c/e PSYCH: Normally interactive. Conversant.   EKG: SR with either partial RBBB or rsr' V1- no old EKG for comparison  Assessment and Plan: Screening for hyperlipidemia - Plan: Lipid panel  Screening for diabetes mellitus - Plan: Comprehensive metabolic panel, Hemoglobin A1c  Chest pain, unspecified type - Plan: TSH, EKG 12-Lead, Troponin I, DG Chest 2 View  Stress at work  Fatigue, unspecified type - Plan: CBC, TSH, VITAMIN D 25 Hydroxy (Vit-D Deficiency, Fractures)  Needs flu shot - Plan: Flu Vaccine QUAD 6+ mos PF IM (Fluarix Quad PF)  Pt here today with a few concerns. He admits to being stressed, just has too much going on at home and work.  He does not wish to take medication for depression/ anxiety as he feels this is situational. I encouraged him to look for ways that he can lighten his load, he will let me know if not doing ok Routine labs as above Flu shot Pt also mentions atypical CP today.  EKG is reassuring.  Plan to get a chest film and troponin today. If all normal plan for ETT.  Pt offered and declined ER eval today.  He states understanding of and  agreement with plan.   This visit occurred during the SARS-CoV-2 public health emergency.  Safety protocols were in place, including screening questions prior to the visit, additional usage of staff PPE, and extensive cleaning of exam room while observing appropriate contact time as indicated for disinfecting solutions.    Signed Lamar Blinks, MD  Received his chest film and labs so far DG  Chest 2 View  Result Date: 11/26/2019 CLINICAL DATA:  Intermittent chest pain. EXAM: CHEST - 2 VIEW COMPARISON:  12/09/2014. FINDINGS: Mediastinum and hilar structures normal. Lungs are clear. No pleural effusion or pneumothorax. Heart size normal. No acute bony abnormality. IMPRESSION: No acute cardiopulmonary disease. Electronically Signed   By: Marcello Moores  Register   On: 11/26/2019 13:43     Received negative troponin Message to pt Will order ETT  addnd 9/24, received further labs as below.  Message to patient A1c is slightly higher than previous Results for orders placed or performed in visit on 11/26/19  CBC  Result Value Ref Range   WBC 5.6 3.8 - 10.8 Thousand/uL   RBC 5.68 4.20 - 5.80 Million/uL   Hemoglobin 16.9 13.2 - 17.1 g/dL   HCT 49.8 38 - 50 %   MCV 87.7 80.0 - 100.0 fL   MCH 29.8 27.0 - 33.0 pg   MCHC 33.9 32.0 - 36.0 g/dL   RDW 12.5 11.0 - 15.0 %   Platelets 145 140 - 400 Thousand/uL   MPV 13.3 (H) 7.5 - 12.5 fL  Comprehensive metabolic panel  Result Value Ref Range   Glucose, Bld 101 (H) 65 - 99 mg/dL   BUN 14 7 - 25 mg/dL   Creat 0.95 0.60 - 1.35 mg/dL   BUN/Creatinine Ratio NOT APPLICABLE 6 - 22 (calc)   Sodium 140 135 - 146 mmol/L   Potassium 4.5 3.5 - 5.3 mmol/L   Chloride 104 98 - 110 mmol/L   CO2 29 20 - 32 mmol/L   Calcium 9.9 8.6 - 10.3 mg/dL   Total Protein 7.2 6.1 - 8.1 g/dL   Albumin 4.7 3.6 - 5.1 g/dL   Globulin 2.5 1.9 - 3.7 g/dL (calc)   AG Ratio 1.9 1.0 - 2.5 (calc)   Total Bilirubin 0.5 0.2 - 1.2 mg/dL   Alkaline phosphatase (APISO) 60 36 - 130 U/L    AST 16 10 - 40 U/L   ALT 26 9 - 46 U/L  Hemoglobin A1c  Result Value Ref Range   Hgb A1c MFr Bld 5.9 (H) <5.7 % of total Hgb   Mean Plasma Glucose 123 (calc)   eAG (mmol/L) 6.8 (calc)  Lipid panel  Result Value Ref Range   Cholesterol 206 (H) <200 mg/dL   HDL 45 > OR = 40 mg/dL   Triglycerides 160 (H) <150 mg/dL   LDL Cholesterol (Calc) 131 (H) mg/dL (calc)   Total CHOL/HDL Ratio 4.6 <5.0 (calc)   Non-HDL Cholesterol (Calc) 161 (H) <130 mg/dL (calc)  TSH  Result Value Ref Range   TSH 1.95 0.40 - 4.50 mIU/L  VITAMIN D 25 Hydroxy (Vit-D Deficiency, Fractures)  Result Value Ref Range   Vit D, 25-Hydroxy 30 30 - 100 ng/mL  Troponin I  Result Value Ref Range   Troponin I 3 < OR = 47 ng/L

## 2019-11-24 NOTE — Patient Instructions (Addendum)
It was nice to see you again today!  Please let me know if anything changes or gets worse  I am sorry you are under so much stress We will be in touch with your labs and also your chest film- please go over to [facility] imaging at [facility] at your convenience

## 2019-11-26 ENCOUNTER — Ambulatory Visit: Payer: Federal, State, Local not specified - PPO | Admitting: Family Medicine

## 2019-11-26 ENCOUNTER — Encounter: Payer: Self-pay | Admitting: Family Medicine

## 2019-11-26 ENCOUNTER — Ambulatory Visit
Admission: RE | Admit: 2019-11-26 | Discharge: 2019-11-26 | Disposition: A | Discharge status: Home / Self Care | Payer: Federal, State, Local not specified - PPO | Source: Ambulatory Visit | Attending: Family Medicine | Admitting: Family Medicine

## 2019-11-26 ENCOUNTER — Other Ambulatory Visit: Payer: Self-pay

## 2019-11-26 VITALS — BP 122/80 | HR 75 | Resp 16 | Ht 73.0 in | Wt 236.0 lb

## 2019-11-26 DIAGNOSIS — R079 Chest pain, unspecified: Secondary | ICD-10-CM | POA: Diagnosis not present

## 2019-11-26 DIAGNOSIS — Z131 Encounter for screening for diabetes mellitus: Secondary | ICD-10-CM | POA: Diagnosis not present

## 2019-11-26 DIAGNOSIS — Z13220 Encounter for screening for lipoid disorders: Secondary | ICD-10-CM | POA: Diagnosis not present

## 2019-11-26 DIAGNOSIS — Z566 Other physical and mental strain related to work: Secondary | ICD-10-CM

## 2019-11-26 DIAGNOSIS — R7303 Prediabetes: Secondary | ICD-10-CM

## 2019-11-26 DIAGNOSIS — R5383 Other fatigue: Secondary | ICD-10-CM

## 2019-11-26 DIAGNOSIS — Z23 Encounter for immunization: Secondary | ICD-10-CM

## 2019-11-26 LAB — TROPONIN I: Troponin I: 3 ng/L (ref <=47)

## 2019-11-27 ENCOUNTER — Encounter: Payer: Self-pay | Admitting: Family Medicine

## 2019-11-27 DIAGNOSIS — R7303 Prediabetes: Secondary | ICD-10-CM | POA: Insufficient documentation

## 2019-11-27 LAB — COMPREHENSIVE METABOLIC PANEL
AG Ratio: 1.9 (calc) (ref 1.0–2.5)
ALT: 26 U/L (ref 9–46)
AST: 16 U/L (ref 10–40)
Albumin: 4.7 g/dL (ref 3.6–5.1)
Alkaline phosphatase (APISO): 60 U/L (ref 36–130)
BUN: 14 mg/dL (ref 7–25)
CO2: 29 mmol/L (ref 20–32)
Calcium: 9.9 mg/dL (ref 8.6–10.3)
Chloride: 104 mmol/L (ref 98–110)
Creat: 0.95 mg/dL (ref 0.60–1.35)
Globulin: 2.5 g/dL (calc) (ref 1.9–3.7)
Glucose, Bld: 101 mg/dL — ABNORMAL HIGH (ref 65–99)
Potassium: 4.5 mmol/L (ref 3.5–5.3)
Sodium: 140 mmol/L (ref 135–146)
Total Bilirubin: 0.5 mg/dL (ref 0.2–1.2)
Total Protein: 7.2 g/dL (ref 6.1–8.1)

## 2019-11-27 LAB — LIPID PANEL
Cholesterol: 206 mg/dL — ABNORMAL HIGH (ref <200)
HDL: 45 mg/dL (ref >=40)
LDL Cholesterol (Calc): 131 mg/dL (calc) — ABNORMAL HIGH
Non-HDL Cholesterol (Calc): 161 mg/dL (calc) — ABNORMAL HIGH (ref <130)
Total CHOL/HDL Ratio: 4.6 (calc) (ref <5.0)
Triglycerides: 160 mg/dL — ABNORMAL HIGH (ref <150)

## 2019-11-27 LAB — HEMOGLOBIN A1C
Hgb A1c MFr Bld: 5.9 % of total Hgb — ABNORMAL HIGH (ref <5.7)
Mean Plasma Glucose: 123 (calc)
eAG (mmol/L): 6.8 (calc)

## 2019-11-27 LAB — CBC
HCT: 49.8 % (ref 38.5–50.0)
Hemoglobin: 16.9 g/dL (ref 13.2–17.1)
MCH: 29.8 pg (ref 27.0–33.0)
MCHC: 33.9 g/dL (ref 32.0–36.0)
MCV: 87.7 fL (ref 80.0–100.0)
MPV: 13.3 fL — ABNORMAL HIGH (ref 7.5–12.5)
Platelets: 145 10*3/uL (ref 140–400)
RBC: 5.68 10*6/uL (ref 4.20–5.80)
RDW: 12.5 % (ref 11.0–15.0)
WBC: 5.6 10*3/uL (ref 3.8–10.8)

## 2019-11-27 LAB — TSH: TSH: 1.95 mIU/L (ref 0.40–4.50)

## 2019-11-27 LAB — VITAMIN D 25 HYDROXY (VIT D DEFICIENCY, FRACTURES): Vit D, 25-Hydroxy: 30 ng/mL (ref 30–100)

## 2019-12-02 ENCOUNTER — Encounter (HOSPITAL_COMMUNITY): Payer: Self-pay

## 2019-12-07 ENCOUNTER — Other Ambulatory Visit (HOSPITAL_COMMUNITY)
Admission: RE | Admit: 2019-12-07 | Discharge: 2019-12-07 | Disposition: A | Discharge status: Home / Self Care | Payer: Federal, State, Local not specified - PPO | Source: Ambulatory Visit | Attending: Cardiology | Admitting: Cardiology

## 2019-12-07 DIAGNOSIS — Z01812 Encounter for preprocedural laboratory examination: Secondary | ICD-10-CM | POA: Insufficient documentation

## 2019-12-07 DIAGNOSIS — Z20822 Contact with and (suspected) exposure to covid-19: Secondary | ICD-10-CM | POA: Diagnosis not present

## 2019-12-07 LAB — SARS CORONAVIRUS 2 (TAT 6-24 HRS): SARS Coronavirus 2: NEGATIVE

## 2019-12-09 ENCOUNTER — Telehealth (HOSPITAL_COMMUNITY): Payer: Self-pay | Admitting: *Deleted

## 2019-12-09 NOTE — Telephone Encounter (Signed)
Close encounter 

## 2019-12-10 ENCOUNTER — Ambulatory Visit (HOSPITAL_COMMUNITY)
Admission: RE | Admit: 2019-12-10 | Discharge: 2019-12-10 | Disposition: A | Discharge status: Home / Self Care | Payer: Federal, State, Local not specified - PPO | Source: Ambulatory Visit | Attending: Cardiology | Admitting: Cardiology

## 2019-12-10 ENCOUNTER — Encounter: Payer: Self-pay | Admitting: Family Medicine

## 2019-12-10 ENCOUNTER — Other Ambulatory Visit: Payer: Self-pay

## 2019-12-10 DIAGNOSIS — R079 Chest pain, unspecified: Secondary | ICD-10-CM | POA: Diagnosis not present

## 2019-12-10 LAB — EXERCISE TOLERANCE TEST
Estimated workload: 16.5 METS
Exercise duration (min): 13 min
Exercise duration (sec): 38 s
MPHR: 182 {beats}/min
Peak HR: 171 {beats}/min
Percent HR: 93 %
Rest HR: 74 {beats}/min

## 2020-01-28 NOTE — Progress Notes (Signed)
[place] at [facility] [place], [place],  [address] [phone]  Date:  02/01/2020   Name:  Carl Rivera   DOB:  [birth year]   MRN:  [id number]  PCP:  Darreld Mclean, MD    Chief Complaint: Annual Exam   History of Present Illness:  Carl Rivera is a 39 y.o. 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 Prediabetes 11/27/2019  . Right lower quadrant abdominal pain 09/18/2018  . Overweight 12/24/2016  . Benign neoplasm of colon 08/05/2013  . Family history of malignant neoplasm of gastrointestinal tract 08/05/2013    Past Medical History:  Diagnosis Date  . Family history of colon cancer   . Localized superficial swelling, mass, or lump    pt unsure what this is.    Past Surgical History:  Procedure  Laterality Date  . COLONOSCOPY    . LAPAROSCOPIC APPENDECTOMY N/A 09/18/2018   Procedure: APPENDECTOMY LAPAROSCOPIC;  Surgeon: Alphonsa Overall, MD;  Location: [facility];  Service: General;  Laterality: N/A;  . LIPOMA EXCISION     x2/ on side abdomen  . TOE SURGERY  2011   Bil pinkey toe surgery/ removed the bones    Social History   Tobacco Use  . Smoking status: Never Smoker  . Smokeless tobacco: Never Used  Vaping Use  . Vaping Use: Never used  Substance Use Topics  . Alcohol use: Yes    Comment: occasional/ twice a month  . Drug use: Never    Family History  Problem Relation Age of Onset  . Familial polyposis Father        multiple polyps (40-60) ; unknown pathology; no cancer  . Cancer Paternal Grandmother 87       colon cancer; breast cancer in 66s  . Cancer Paternal Uncle 86       colorectal cancer  . Familial polyposis Paternal Uncle 73       polyp history; unknown type or number  . Cancer Other 57       1st cousin once removed (pat grandmother's sister's son) with colorectal cancer  .  Colon polyps Brother   . ALS Maternal Grandfather     No Active Allergies  Medication list has been reviewed and updated.  No current outpatient medications on file prior to visit.   No current facility-administered medications on file prior to visit.    Review of Systems:  As per HPI- otherwise negative.   Physical Examination: Vitals:   02/01/20 0819  BP: 128/80  Pulse: 65  Resp: 17  SpO2: 97%   Vitals:   02/01/20 0819  Weight: 238 lb (108 kg)  Height: 6\' 1"  (1.854 m)   Body mass index is 31.4 kg/m. Ideal Body Weight: Weight in (lb) to have BMI = 25: 189.1  GEN: no acute distress.  Tall build, mild overweight, looks well  HEENT: Atraumatic, Normocephalic.   Bilateral TM wnl, oropharynx normal.  PEERL,EOMI.   Ears and Nose: No external deformity. CV: RRR, No M/G/R. No JVD. No thrill. No extra heart sounds. PULM: CTA B, no wheezes, crackles, rhonchi. No  retractions. No resp. distress. No accessory muscle use. ABD: S, NT, ND. No rebound. No HSM. EXTR: No c/c/e PSYCH: Normally interactive. Conversant.    Assessment and Plan: Physical exam  Pre-diabetes  Dyslipidemia  Here today for a CPE Overall doing well today He continues to work on diet, exercise and stress management He is getting regular exercise Labs and IUTD today He will see me in 6 months to check on his A1c and labs- will let me know if any concerns in the meantime   .[email or web address]   Signed Lamar Blinks, MD

## 2020-02-01 ENCOUNTER — Encounter: Payer: Self-pay | Admitting: Family Medicine

## 2020-02-01 ENCOUNTER — Ambulatory Visit (INDEPENDENT_AMBULATORY_CARE_PROVIDER_SITE_OTHER): Payer: Federal, State, Local not specified - PPO | Admitting: Family Medicine

## 2020-02-01 ENCOUNTER — Other Ambulatory Visit: Payer: Self-pay

## 2020-02-01 VITALS — BP 128/80 | HR 65 | Resp 17 | Ht 73.0 in | Wt 238.0 lb

## 2020-02-01 DIAGNOSIS — R7303 Prediabetes: Secondary | ICD-10-CM

## 2020-02-01 DIAGNOSIS — E785 Hyperlipidemia, unspecified: Secondary | ICD-10-CM | POA: Diagnosis not present

## 2020-02-01 DIAGNOSIS — Z0000 Encounter for general adult medical examination without abnormal findings: Secondary | ICD-10-CM

## 2020-02-01 NOTE — Patient Instructions (Addendum)
It was good to see you again today!  Take care and let me know if you need anything Let's visit in about 6 months to check your lipids and A1c   Health Maintenance, Male Adopting a healthy lifestyle and getting preventive care are important in promoting health and wellness. Ask your health care provider about:  The right schedule for you to have regular tests and exams.  Things you can do on your own to prevent diseases and keep yourself healthy. What should I know about diet, weight, and exercise? Eat a healthy diet   Eat a diet that includes plenty of vegetables, fruits, low-fat dairy products, and lean protein.  Do not eat a lot of foods that are high in solid fats, added sugars, or sodium. Maintain a healthy weight Body mass index (BMI) is a measurement that can be used to identify possible weight problems. It estimates body fat based on height and weight. Your health care provider can help determine your BMI and help you achieve or maintain a healthy weight. Get regular exercise Get regular exercise. This is one of the most important things you can do for your health. Most adults should:  Exercise for at least 150 minutes each week. The exercise should increase your heart rate and make you sweat (moderate-intensity exercise).  Do strengthening exercises at least twice a week. This is in addition to the moderate-intensity exercise.  Spend less time sitting. Even light physical activity can be beneficial. Watch cholesterol and blood lipids Have your blood tested for lipids and cholesterol at 38 years of age, then have this test every 5 years. You may need to have your cholesterol levels checked more often if:  Your lipid or cholesterol levels are high.  You are older than 38 years of age.  You are at high risk for heart disease. What should I know about cancer screening? Many types of cancers can be detected early and may often be prevented. Depending on your health history  and family history, you may need to have cancer screening at various ages. This may include screening for:  Colorectal cancer.  Prostate cancer.  Skin cancer.  Lung cancer. What should I know about heart disease, diabetes, and high blood pressure? Blood pressure and heart disease  High blood pressure causes heart disease and increases the risk of stroke. This is more likely to develop in people who have high blood pressure readings, are of African descent, or are overweight.  Talk with your health care provider about your target blood pressure readings.  Have your blood pressure checked: ? Every 3-5 years if you are 13-22 years of age. ? Every year if you are 22 years old or older.  If you are between the ages of 22 and 53 and are a current or former smoker, ask your health care provider if you should have a one-time screening for abdominal aortic aneurysm (AAA). Diabetes Have regular diabetes screenings. This checks your fasting blood sugar level. Have the screening done:  Once every three years after age 78 if you are at a normal weight and have a low risk for diabetes.  More often and at a younger age if you are overweight or have a high risk for diabetes. What should I know about preventing infection? Hepatitis B If you have a higher risk for hepatitis B, you should be screened for this virus. Talk with your health care provider to find out if you are at risk for hepatitis B infection. Hepatitis  C Blood testing is recommended for:  Everyone born from [birth year] through 1965.  Anyone with known risk factors for hepatitis C. Sexually transmitted infections (STIs)  You should be screened each year for STIs, including gonorrhea and chlamydia, if: ? You are sexually active and are younger than 38 years of age. ? You are older than 38 years of age and your health care provider tells you that you are at risk for this type of infection. ? Your sexual activity has changed since you were  last screened, and you are at increased risk for chlamydia or gonorrhea. Ask your health care provider if you are at risk.  Ask your health care provider about whether you are at high risk for HIV. Your health care provider may recommend a prescription medicine to help prevent HIV infection. If you choose to take medicine to prevent HIV, you should first get tested for HIV. You should then be tested every 3 months for as long as you are taking the medicine. Follow these instructions at home: Lifestyle  Do not use any products that contain nicotine or tobacco, such as cigarettes, e-cigarettes, and chewing tobacco. If you need help quitting, ask your health care provider.  Do not use street drugs.  Do not share needles.  Ask your health care provider for help if you need support or information about quitting drugs. Alcohol use  Do not drink alcohol if your health care provider tells you not to drink.  If you drink alcohol: ? Limit how much you have to 0-2 drinks a day. ? Be aware of how much alcohol is in your drink. In the [place] one drink equals one 12 oz bottle of beer (355 mL), one 5 oz glass of wine (148 mL), or one 1 oz glass of hard liquor (44 mL). General instructions  Schedule regular health, dental, and eye exams.  Stay current with your vaccines.  Tell your health care provider if: ? You often feel depressed. ? You have ever been abused or do not feel safe at home. Summary  Adopting a healthy lifestyle and getting preventive care are important in promoting health and wellness.  Follow your health care provider's instructions about healthy diet, exercising, and getting tested or screened for diseases.  Follow your health care provider's instructions on monitoring your cholesterol and blood pressure. This information is not intended to replace advice given to you by your health care provider. Make sure you discuss any questions you have with your health care  provider. Document Revised: 02/12/2018 Document Reviewed: 02/12/2018 Elsevier Patient Education  2020 [employer].

## 2020-02-16 ENCOUNTER — Other Ambulatory Visit: Payer: Self-pay | Admitting: Family Medicine

## 2020-02-16 ENCOUNTER — Encounter: Payer: Self-pay | Admitting: Family Medicine

## 2020-02-16 MED ORDER — TERBINAFINE HCL 250 MG PO TABS: 250.0000 mg | ORAL_TABLET | Freq: Every day | ORAL | 0 refills | Status: DC

## 2020-03-02 MED ORDER — KETOCONAZOLE 2 % EX CREA: 1.0000 "application " | TOPICAL_CREAM | Freq: Every day | CUTANEOUS | 1 refills | Status: DC

## 2020-03-02 NOTE — Telephone Encounter (Signed)
Patient states he is much better. However the medication is almost over. Patient is wondering if you could send another prescription.

## 2020-03-09 ENCOUNTER — Other Ambulatory Visit: Payer: Federal, State, Local not specified - PPO

## 2020-03-17 ENCOUNTER — Ambulatory Visit: Payer: Federal, State, Local not specified - PPO | Admitting: Family Medicine

## 2020-03-17 ENCOUNTER — Other Ambulatory Visit: Payer: Self-pay

## 2020-03-17 ENCOUNTER — Encounter: Payer: Self-pay | Admitting: Family Medicine

## 2020-03-17 VITALS — BP 112/72 | HR 72 | Resp 16 | Ht 73.0 in | Wt 235.0 lb

## 2020-03-17 DIAGNOSIS — R21 Rash and other nonspecific skin eruption: Secondary | ICD-10-CM | POA: Diagnosis not present

## 2020-03-17 MED ORDER — TERBINAFINE HCL 250 MG PO TABS: 250.0000 mg | ORAL_TABLET | Freq: Every day | ORAL | 0 refills | Status: DC

## 2020-03-17 MED ORDER — KETOCONAZOLE 2 % EX CREA: 1.0000 "application " | TOPICAL_CREAM | Freq: Every day | CUTANEOUS | 1 refills | Status: DC

## 2020-03-17 NOTE — Progress Notes (Signed)
[place] [facility] [place], [place], [place] [address] [phone]  Date:  03/17/2020   Name:  Carl Rivera   DOB:  [birth year]   MRN:  [id number]  PCP:  Darreld Mclean, MD    Chief Complaint: Med Check and Rash (Rash on thighs, starts below hips and stops above knee, itching, redness)   History of Present Illness:  Carl Rivera is a 40 y.o. very pleasant male patient who presents with the following:  Here today to follow up on a rash on his lower body  There is a lot of covid going through his family right now- they had it over the holidays but everyone is well now  He first noticed a rash on his bilateral legs - he first messaged me about this in mid December He uses some OTC antifungals  It may have started first after he was cutting down some trees on his property He tried treating it with calamine lotion but it got worse- he then tried some OTC antifungal treatments - miconazole powder and tolnaftate spray We did a course of oral Lamisil in mid December  Over Christmas he went to an UC in [place] and was given a small tube of ketoconazole - he used this and it did help. I refilled this for him so he could use longer and he did seem to be making progress  He wore a pair of minimally damp jeans (from the dryer) last weekend and things got bad again-the area was quite red and inflamed.  He does quite it again since that time  The rash it itchy but not painful He otherwise feels fine  As the weather gets drier he has done a bit better He is otherwise feeling well   Patient Active Problem List   Diagnosis Date Noted  . Prediabetes 11/27/2019  . Right lower quadrant abdominal pain 09/18/2018  . Overweight 12/24/2016  . Benign neoplasm of colon 08/05/2013  . Family history of malignant neoplasm of gastrointestinal tract 08/05/2013    Past Medical History:  Diagnosis Date  . Family history of colon cancer   .  Localized superficial swelling, mass, or lump    pt unsure what this is.    Past Surgical History:  Procedure Laterality Date  . COLONOSCOPY    . LAPAROSCOPIC APPENDECTOMY N/A 09/18/2018   Procedure: APPENDECTOMY LAPAROSCOPIC;  Surgeon: Alphonsa Overall, MD;  Location: [facility];  Service: General;  Laterality: N/A;  . LIPOMA EXCISION     x2/ on side abdomen  . TOE SURGERY  2011   Bil pinkey toe surgery/ removed the bones    Social History   Tobacco Use  . Smoking status: Never Smoker  . Smokeless tobacco: Never Used  Vaping Use  . Vaping Use: Never used  Substance Use Topics  . Alcohol use: Yes    Comment: occasional/ twice a month  . Drug use: Never    Family History  Problem Relation Age of Onset  . Familial polyposis Father        multiple polyps (47-60) ; unknown pathology; no cancer  . Cancer Paternal Grandmother 30       colon cancer; breast cancer in 61s  . Cancer Paternal Uncle 79       colorectal cancer  . Familial polyposis Paternal Uncle 63       polyp history; unknown type or number  . Cancer Other 32  1st cousin once removed (pat grandmother's sister's son) with colorectal cancer  . Colon polyps Brother   . ALS Maternal Grandfather     No Active Allergies  Medication list has been reviewed and updated.  Current Outpatient Medications on File Prior to Visit  Medication Sig Dispense Refill  . ketoconazole (NIZORAL) 2 % cream Apply 1 application topically daily. 60 g 1  . terbinafine (LAMISIL) 250 MG tablet Take 1 tablet (250 mg total) by mouth daily. 14 tablet 0   No current facility-administered medications on file prior to visit.    Review of Systems:  As per HPI- otherwise negative.   Physical Examination: Vitals:   03/17/20 0928  BP: 112/72  Pulse: 72  Resp: 16  SpO2: 97%   Vitals:   03/17/20 0928  Weight: 235 lb (106.6 kg)  Height: 6\' 1"  (1.854 m)   Body mass index is 31 kg/m. Ideal Body Weight: Weight in (lb) to have BMI =  25: 189.1  GEN: no acute distress.  Looks well and his normal self HEENT: Atraumatic, Normocephalic.  Patient denies any mouth lesions Ears and Nose: No external deformity. CV: RRR, No M/G/R. No JVD. No thrill. No extra heart sounds. PULM: CTA B, no wheezes, crackles, rhonchi. No retractions. No resp. distress. No accessory muscle use. EXTR: No c/c/e PSYCH: Normally interactive. Conversant.  Well demarcated, hyperpigmented confluent rashes present over both anterior thighs At this time the rash appears quiet, patient notes it is oftentimes more red       Assessment and Plan: Rash and nonspecific skin eruption - Plan: terbinafine (LAMISIL) 250 MG tablet, ketoconazole (NIZORAL) 2 % cream  Patient is here today with a persistent, waxing and waning rash on both anterior thighs for about 6 weeks.  It has seemed to respond to antifungals, but then continues to come back.  He did take a course of oral antifungal earlier in the course which seemed helpful temporarily.  However, rash has flared back up again.  We decided to use oral terbinafine again, 250 daily for 2 to 4 weeks.  We checked liver function labs in September, normal.  Patient declines recheck today We will also continue ketoconazole cream as needed.  Advised patient that if the rash does not go away with this course of treatment I would like to consult with dermatology.  He is planning to visit [place] with his family next week, I hope that spending time in the sunshine, hopefully wearing shorts will help clear this up  He will let me know sooner if any concerns or if getting worse  This visit occurred during the SARS-CoV-2 public health emergency.  Safety protocols were in place, including screening questions prior to the visit, additional usage of staff PPE, and extensive cleaning of exam room while observing appropriate contact time as indicated for disinfecting solutions.    Signed Lamar Blinks, MD

## 2020-03-17 NOTE — Patient Instructions (Addendum)
It was good to see you again today- we will try oral terbinafine daily for 2-4 weeks  If this does not work we will have you see derm Ok to continue the topical antifungal ketoconazole as well   Please let me know how you do

## 2020-07-30 ENCOUNTER — Encounter: Payer: Self-pay | Admitting: Family Medicine
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
## 2020-08-02 NOTE — Patient Instructions (Addendum)
Good to see you again today!  Please go to the ground floor imaging dept to have an x-ray of your right shin. I will let you know what this shows asap! We froze a likely seborrheic keratosis (benign skin lesion) on your scap today. Hopefully this will dry up and peel off but let me know if it persists.  The scalp is a common location for skin cancer, so if it does not go away we will get derm to take a look for you  Take care Carl Rivera

## 2020-08-08 ENCOUNTER — Encounter: Payer: Self-pay | Admitting: Family Medicine

## 2020-08-08 ENCOUNTER — Ambulatory Visit: Payer: Federal, State, Local not specified - PPO | Admitting: Family Medicine

## 2020-08-08 ENCOUNTER — Ambulatory Visit (HOSPITAL_BASED_OUTPATIENT_CLINIC_OR_DEPARTMENT_OTHER)
Admission: RE | Admit: 2020-08-08 | Discharge: 2020-08-08 | Disposition: A | Discharge status: Home / Self Care | Payer: Federal, State, Local not specified - PPO | Source: Ambulatory Visit | Attending: Family Medicine | Admitting: Family Medicine

## 2020-08-08 ENCOUNTER — Other Ambulatory Visit: Payer: Self-pay

## 2020-08-08 VITALS — BP 124/80 | HR 82 | Resp 16 | Ht 73.0 in | Wt 240.0 lb

## 2020-08-08 DIAGNOSIS — R2241 Localized swelling, mass and lump, right lower limb: Secondary | ICD-10-CM

## 2020-08-08 DIAGNOSIS — L821 Other seborrheic keratosis: Secondary | ICD-10-CM | POA: Diagnosis not present

## 2020-08-08 NOTE — Addendum Note (Signed)
Addended by: Lamar Blinks C on: 08/08/2020 01:29 PM   Modules accepted: Orders

## 2020-08-23 ENCOUNTER — Ambulatory Visit
Admission: RE | Admit: 2020-08-23 | Discharge: 2020-08-23 | Disposition: A | Discharge status: Home / Self Care | Payer: Federal, State, Local not specified - PPO | Source: Ambulatory Visit | Attending: Family Medicine | Admitting: Family Medicine

## 2020-08-23 DIAGNOSIS — R2241 Localized swelling, mass and lump, right lower limb: Secondary | ICD-10-CM | POA: Diagnosis not present

## 2020-08-24 ENCOUNTER — Encounter: Payer: Self-pay | Admitting: Family Medicine

## 2020-10-14 DIAGNOSIS — F439 Reaction to severe stress, unspecified: Secondary | ICD-10-CM | POA: Diagnosis not present

## 2020-10-14 DIAGNOSIS — Z630 Problems in relationship with spouse or partner: Secondary | ICD-10-CM | POA: Diagnosis not present

## 2020-10-19 DIAGNOSIS — F439 Reaction to severe stress, unspecified: Secondary | ICD-10-CM | POA: Diagnosis not present

## 2020-10-19 DIAGNOSIS — Z630 Problems in relationship with spouse or partner: Secondary | ICD-10-CM | POA: Diagnosis not present

## 2020-11-24 ENCOUNTER — Encounter: Payer: Self-pay | Admitting: Family Medicine

## 2020-11-24 DIAGNOSIS — J0110 Acute frontal sinusitis, unspecified: Secondary | ICD-10-CM

## 2020-11-24 MED ORDER — AMOXICILLIN 500 MG PO CAPS: 1000.0000 mg | ORAL_CAPSULE | Freq: Two times a day (BID) | ORAL | 0 refills | Status: DC

## 2021-01-13 ENCOUNTER — Encounter: Payer: Self-pay | Admitting: Emergency Medicine

## 2021-01-13 ENCOUNTER — Ambulatory Visit
Admission: EM | Admit: 2021-01-13 | Discharge: 2021-01-13 | Disposition: A | Discharge status: Home / Self Care | Payer: Federal, State, Local not specified - PPO | Attending: Internal Medicine | Admitting: Internal Medicine

## 2021-01-13 ENCOUNTER — Other Ambulatory Visit: Payer: Self-pay

## 2021-01-13 DIAGNOSIS — J019 Acute sinusitis, unspecified: Secondary | ICD-10-CM

## 2021-01-13 MED ORDER — PROMETHAZINE-DM 6.25-15 MG/5ML PO SYRP: 5.0000 mL | ORAL_SOLUTION | Freq: Four times a day (QID) | ORAL | 0 refills | Status: DC | PRN

## 2021-01-13 MED ORDER — AMOXICILLIN-POT CLAVULANATE 875-125 MG PO TABS: 1.0000 | ORAL_TABLET | Freq: Two times a day (BID) | ORAL | 0 refills | Status: AC

## 2021-01-13 MED ORDER — FLUTICASONE PROPIONATE 50 MCG/ACT NA SUSP: 1.0000 | Freq: Every day | NASAL | 0 refills | Status: DC

## 2021-01-13 NOTE — ED Triage Notes (Signed)
URI x 2 weeks. C/o worsening green mucus, uses netty pot

## 2021-01-13 NOTE — Discharge Instructions (Signed)
Please take medications as prescribed I expect your symptoms to start getting better in the next few days Maintain adequate hydration Humidifier use at home will help with nasal congestion Continue saline nasal rinse. Return to urgent care if symptoms worsen.

## 2021-01-13 NOTE — ED Provider Notes (Addendum)
[facility]    CSN: [id number] Arrival date & time: 01/13/21  0836      History   Chief Complaint Chief Complaint  Patient presents with   Cough   Nasal Congestion    HPI Carl Rivera is a 40 y.o. male comes to the urgent care with 7 to 10-day history of nasal congestion, cough productive of greenish sputum and postnasal drip.  Patient's symptoms started over a week ago and has been worsening.  Cough is worse at night.  Is associated with postnasal drip.  It is associated with some facial pressure.  No retro-orbital pain.  He denies any fever or chills.  He has been try Nettie pot with no improvement in symptoms.  No shortness of breath or wheezing.  No chest pain or chest tightness. HPI  Past Medical History:  Diagnosis Date   Family history of colon cancer    Localized superficial swelling, mass, or lump    pt unsure what this is.    Patient Active Problem List   Diagnosis Date Noted   Prediabetes 11/27/2019   Right lower quadrant abdominal pain 09/18/2018   Overweight 12/24/2016   Benign neoplasm of colon 08/05/2013   Family history of malignant neoplasm of gastrointestinal tract 08/05/2013    Past Surgical History:  Procedure Laterality Date   COLONOSCOPY     LAPAROSCOPIC APPENDECTOMY N/A 09/18/2018   Procedure: APPENDECTOMY LAPAROSCOPIC;  Surgeon: Alphonsa Overall, MD;  Location: [facility];  Service: General;  Laterality: N/A;   LIPOMA EXCISION     x2/ on side abdomen   TOE SURGERY  2011   Bil pinkey toe surgery/ removed the bones       Home Medications    Prior to Admission medications   Medication Sig Start Date End Date Taking? Authorizing Provider  amoxicillin-clavulanate (AUGMENTIN) 875-125 MG tablet Take 1 tablet by mouth every 12 (twelve) hours for 7 days. 01/13/21 01/20/21 Yes Tobias Avitabile, Myrene Galas, MD  fluticasone (FLONASE) 50 MCG/ACT nasal spray Place 1 spray into both nostrils daily. 01/13/21  Yes Demaya Hardge, Myrene Galas, MD   promethazine-dextromethorphan (PROMETHAZINE-DM) 6.25-15 MG/5ML syrup Take 5 mLs by mouth 4 (four) times daily as needed for cough. 01/13/21  Yes Koran Seabrook, Myrene Galas, MD    Family History Family History  Problem Relation Age of Onset   Familial polyposis Father        multiple polyps (52-60) ; unknown pathology; no cancer   Cancer Paternal Grandmother 78       colon cancer; breast cancer in 66s   Cancer Paternal Uncle 12       colorectal cancer   Familial polyposis Paternal Uncle 23       polyp history; unknown type or number   Cancer Other 30       1st cousin once removed (pat grandmother's sister's son) with colorectal cancer   Colon polyps Brother    ALS Maternal Grandfather     Social History Social History   Tobacco Use   Smoking status: Never   Smokeless tobacco: Never  Vaping Use   Vaping Use: Never used  Substance Use Topics   Alcohol use: Yes    Comment: occasional/ twice a month   Drug use: Never     Allergies   Patient has no known allergies.   Review of Systems Review of Systems  Constitutional:  Negative for activity change, chills and fever.  HENT:  Positive for congestion, postnasal drip and sore throat.   Respiratory:  Positive for cough. Negative for chest tightness, shortness of breath and wheezing.   Cardiovascular: Negative.   Gastrointestinal: Negative.     Physical Exam Triage Vital Signs ED Triage Vitals [01/13/21 0935]  Enc Vitals Group     BP 134/81     Pulse Rate 83     Resp 16     Temp 98 F (36.7 C)     Temp Source Oral     SpO2 97 %     Weight      Height      Head Circumference      Peak Flow      Pain Score 0     Pain Loc      Pain Edu?      Excl. in [place]?    No data found.  Updated Vital Signs BP 134/81 (BP Location: Left Arm)   Pulse 83   Temp 98 F (36.7 C) (Oral)   Resp 16   SpO2 97%   Visual Acuity Right Eye Distance:   Left Eye Distance:   Bilateral Distance:    Right Eye Near:   Left Eye Near:     Bilateral Near:     Physical Exam Vitals and nursing note reviewed.  Constitutional:      General: He is not in acute distress.    Appearance: He is not ill-appearing.  HENT:     Right Ear: Tympanic membrane normal.     Left Ear: Tympanic membrane normal.     Mouth/Throat:     Pharynx: No posterior oropharyngeal erythema.  Cardiovascular:     Rate and Rhythm: Normal rate and regular rhythm.     Pulses: Normal pulses.     Heart sounds: Normal heart sounds.  Pulmonary:     Effort: Pulmonary effort is normal.     Breath sounds: Normal breath sounds.  Neurological:     Mental Status: He is alert.     UC Treatments / Results  Labs (all labs ordered are listed, but only abnormal results are displayed) Labs Reviewed - No data to display  EKG   Radiology No results found.  Procedures Procedures (including critical care time)  Medications Ordered in UC Medications - No data to display  Initial Impression / Assessment and Plan / UC Course  I have reviewed the triage vital signs and the nursing notes.  Pertinent labs & imaging results that were available during my care of the patient were reviewed by me and considered in my medical decision making (see chart for details).     1.  Right sinusitis with symptoms greater than 10 days: Augmentin 875-125 mg twice daily for 7 days Fluticasone nasal spray Promethazine-DM every 6 hours as needed for cough Maintain adequate hydration Return to urgent care if symptoms worsen. Final Clinical Impressions(s) / UC Diagnoses   Final diagnoses:  Acute sinusitis with symptoms > 10 days     Discharge Instructions      Please take medications as prescribed I expect your symptoms to start getting better in the next few days Maintain adequate hydration Humidifier use at home will help with nasal congestion Continue saline nasal rinse. Return to urgent care if symptoms worsen.   ED Prescriptions     Medication Sig Dispense  Auth. Provider   amoxicillin-clavulanate (AUGMENTIN) 875-125 MG tablet Take 1 tablet by mouth every 12 (twelve) hours for 7 days. 14 tablet Neal Oshea, Myrene Galas, MD   fluticasone (FLONASE) 50 MCG/ACT nasal spray Place 1 spray  into both nostrils daily. 16 g Chase Picket, MD   promethazine-dextromethorphan (PROMETHAZINE-DM) 6.25-15 MG/5ML syrup Take 5 mLs by mouth 4 (four) times daily as needed for cough. 118 mL Aerica Rincon, Myrene Galas, MD      PDMP not reviewed this encounter.   Chase Picket, MD 01/13/21 1019    Chase Picket, MD 01/13/21 1019

## 2021-01-19 ENCOUNTER — Encounter: Payer: Federal, State, Local not specified - PPO | Admitting: Family Medicine

## 2021-01-25 NOTE — Progress Notes (Addendum)
[place] [facility] [place], [place], [place] [address] [phone]  Date:  02/01/2021   Name:  Carl Rivera   DOB:  02-10-1981   MRN:  [id number]  PCP:  Darreld Mclean, MD    Chief Complaint: Annual Exam (Concerns/ questions: none/Flu shot today: yes/)   History of Present Illness:  Carl Rivera is a 40 y.o. 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  He will get a sinus infection frequently when he gets a cold  At this time he does not wish to see ENT- he generally gets better with abx  Can update labs today  Colon 2020- does q 3 years  Flu vaccine- give today  Recommend the latest covid vaccine  His last A1c was a bit elevated- he has cut down on sodas (diet) and is interested to see how this labs look Lab Results  Component Value Date   HGBA1C 5.9 (H) 11/26/2019   Wt Readings from Last 3 Encounters:  02/01/21 242 lb 12.8 oz (110.1 kg)  08/08/20 240 lb (108.9 kg)  03/17/20 235 lb (106.6 kg)   He has put on a few lbs- he wants to get more exercise  No CP or SOB- he was having some CP last year did a treadmill which was negative- sx now resolved  He continues to be quite busy with work and his family  Patient Active Problem List   Diagnosis Date Noted   Prediabetes 11/27/2019   Right lower quadrant abdominal pain 09/18/2018   Overweight 12/24/2016   Benign neoplasm of colon 08/05/2013   Family history of malignant neoplasm of gastrointestinal tract 08/05/2013     Past Medical History:  Diagnosis Date   Family history of colon cancer    Localized superficial swelling, mass, or lump    pt unsure what this is.    Past Surgical History:  Procedure Laterality Date   COLONOSCOPY     LAPAROSCOPIC APPENDECTOMY N/A 09/18/2018   Procedure: APPENDECTOMY LAPAROSCOPIC;  Surgeon: Alphonsa Overall, MD;  Location: [facility];  Service: General;  Laterality: N/A;   LIPOMA EXCISION     x2/ on side abdomen   TOE SURGERY  2011   Bil pinkey toe surgery/ removed the bones    Social History   Tobacco Use   Smoking status: Never   Smokeless tobacco: Never  Vaping Use   Vaping Use: Never used  Substance Use Topics   Alcohol use: Yes    Comment: occasional/ twice a month   Drug use: Never    Family History  Problem Relation Age of Onset   Familial polyposis Father        multiple polyps (38-60) ; unknown pathology; no cancer   Cancer Paternal Grandmother 46       colon cancer; breast cancer in 46s   Cancer Paternal Uncle 109       colorectal cancer   Familial polyposis  Paternal Uncle 15       polyp history; unknown type or number   Cancer Other 35       1st cousin once removed (pat grandmother's sister's son) with colorectal cancer   Colon polyps Brother    ALS Maternal Grandfather     No Known Allergies  Medication list has been reviewed and updated.  No current outpatient medications on file prior to visit.   No current facility-administered medications on file prior to visit.    Review of Systems:  As per HPI- otherwise negative.    Physical Examination: Vitals:   02/01/21 1023  BP: 110/78  Pulse: 65  Resp: 18  Temp: (!) 97.5 F (36.4 C)  SpO2: 98%   Vitals:   02/01/21 1023  Weight: 242 lb 12.8 oz (110.1 kg)  Height: 5\' 11"  (1.803 m)   Body mass index is 33.86 kg/m. Ideal Body Weight: Weight in (lb) to have BMI = 25: 178.9  GEN: no acute distress. Tall build, mildly obese  HEENT: Atraumatic, Normocephalic.  Bilateral TM  wnl, oropharynx normal.  PEERL,EOMI.   Ears and Nose: No external deformity. CV: RRR, No M/G/R. No JVD. No thrill. No extra heart sounds. PULM: CTA B, no wheezes, crackles, rhonchi. No retractions. No resp. distress. No accessory muscle use. ABD: S, NT, ND, +BS. No rebound. No HSM. EXTR: No c/c/e PSYCH: Normally interactive. Conversant.    Assessment and Plan: Physical exam  Screening for hyperlipidemia - Plan: Lipid panel  Pre-diabetes - Plan: Comprehensive metabolic panel, Hemoglobin A1c  Screening for deficiency anemia - Plan: CBC  Need for influenza vaccination - Plan: Flu Vaccine QUAD 6+ mos PF IM (Fluarix Quad PF)  Physical exam today- encouraged healthy diet and exercise routine Flu shot given Can get bivalent booster at his convenience Encouraged him to take time for himself and get exercise  Signed Lamar Blinks, MD  Received labs 12/1- message to pt  Results for orders placed or performed in visit on 02/01/21  CBC  Result Value Ref Range   WBC 5.3 4.0 - 10.5 K/uL   RBC 5.10 4.22 - 5.81 Mil/uL   Platelets 152.0 150.0 - 400.0 K/uL   Hemoglobin 15.2 13.0 - 17.0 g/dL   HCT 44.5 39.0 - 52.0 %   MCV 87.4 78.0 - 100.0 fl   MCHC 34.1 30.0 - 36.0 g/dL   RDW 13.4 11.5 - 15.5 %  Comprehensive metabolic panel  Result Value Ref Range   Sodium 137 135 - 145 mEq/L   Potassium 3.8 3.5 - 5.1 mEq/L   Chloride 102 96 - 112 mEq/L   CO2 29 19 - 32 mEq/L   Glucose, Bld 101 (H) 70 - 99 mg/dL   BUN 20 6 - 23 mg/dL   Creatinine, Ser 0.95 0.40 - 1.50 mg/dL   Total Bilirubin 0.5 0.2 - 1.2 mg/dL   Alkaline Phosphatase 57 39 - 117 U/L   AST 16 0 - 37 U/L   ALT 28 0 - 53 U/L   Total Protein 6.8 6.0 - 8.3 g/dL   Albumin 4.4 3.5 - 5.2 g/dL   GFR 101.02 >60.00 mL/min   Calcium 9.2 8.4 - 10.5 mg/dL  Hemoglobin A1c  Result Value Ref Range   Hgb A1c MFr Bld 6.1 4.6 - 6.5 %  Lipid panel  Result Value Ref Range   Cholesterol 222 (H) 0 - 200 mg/dL   Triglycerides 211.0 (H) 0.0 -  149.0 mg/dL   HDL 48.40 >39.00 mg/dL  VLDL 42.2 (H) 0.0 - 40.0 mg/dL   Total CHOL/HDL Ratio 5    NonHDL 173.49   LDL cholesterol, direct  Result Value Ref Range   Direct LDL 156.0 mg/dL

## 2021-01-25 NOTE — Patient Instructions (Addendum)
Good to see you again today- I will be in touch with your labs asap  Flu shot today Try to make time for your own exercise and take care of yourself Let me know if any chest pain should return

## 2021-02-01 ENCOUNTER — Other Ambulatory Visit: Payer: Self-pay

## 2021-02-01 ENCOUNTER — Ambulatory Visit (INDEPENDENT_AMBULATORY_CARE_PROVIDER_SITE_OTHER): Payer: Federal, State, Local not specified - PPO | Admitting: Family Medicine

## 2021-02-01 VITALS — BP 110/78 | HR 65 | Temp 97.5°F | Resp 18 | Ht 71.0 in | Wt 242.8 lb

## 2021-02-01 DIAGNOSIS — R7303 Prediabetes: Secondary | ICD-10-CM

## 2021-02-01 DIAGNOSIS — Z130 Encounter for screening for diseases of the blood and blood-forming organs and certain disorders involving the immune mechanism: Secondary | ICD-10-CM

## 2021-02-01 DIAGNOSIS — Z23 Encounter for immunization: Secondary | ICD-10-CM

## 2021-02-01 DIAGNOSIS — Z13220 Encounter for screening for lipoid disorders: Secondary | ICD-10-CM

## 2021-02-01 DIAGNOSIS — Z0000 Encounter for general adult medical examination without abnormal findings: Secondary | ICD-10-CM | POA: Diagnosis not present

## 2021-02-01 LAB — LIPID PANEL
Cholesterol: 222 mg/dL — ABNORMAL HIGH (ref 0–200)
HDL: 48.4 mg/dL (ref >39.00)
NonHDL: 173.49
Total CHOL/HDL Ratio: 5
Triglycerides: 211 mg/dL — ABNORMAL HIGH (ref 0.0–149.0)
VLDL: 42.2 mg/dL — ABNORMAL HIGH (ref 0.0–40.0)

## 2021-02-01 LAB — COMPREHENSIVE METABOLIC PANEL
ALT: 28 U/L (ref 0–53)
AST: 16 U/L (ref 0–37)
Albumin: 4.4 g/dL (ref 3.5–5.2)
Alkaline Phosphatase: 57 U/L (ref 39–117)
BUN: 20 mg/dL (ref 6–23)
CO2: 29 mEq/L (ref 19–32)
Calcium: 9.2 mg/dL (ref 8.4–10.5)
Chloride: 102 mEq/L (ref 96–112)
Creatinine, Ser: 0.95 mg/dL (ref 0.40–1.50)
GFR: 101.02 mL/min (ref >60.00)
Glucose, Bld: 101 mg/dL — ABNORMAL HIGH (ref 70–99)
Potassium: 3.8 mEq/L (ref 3.5–5.1)
Sodium: 137 mEq/L (ref 135–145)
Total Bilirubin: 0.5 mg/dL (ref 0.2–1.2)
Total Protein: 6.8 g/dL (ref 6.0–8.3)

## 2021-02-01 LAB — CBC
HCT: 44.5 % (ref 39.0–52.0)
Hemoglobin: 15.2 g/dL (ref 13.0–17.0)
MCHC: 34.1 g/dL (ref 30.0–36.0)
MCV: 87.4 fl (ref 78.0–100.0)
Platelets: 152 10*3/uL (ref 150.0–400.0)
RBC: 5.1 Mil/uL (ref 4.22–5.81)
RDW: 13.4 % (ref 11.5–15.5)
WBC: 5.3 10*3/uL (ref 4.0–10.5)

## 2021-02-01 LAB — LDL CHOLESTEROL, DIRECT: Direct LDL: 156 mg/dL

## 2021-02-01 LAB — HEMOGLOBIN A1C: Hgb A1c MFr Bld: 6.1 % (ref 4.6–6.5)

## 2021-02-02 ENCOUNTER — Encounter: Payer: Self-pay | Admitting: Family Medicine

## 2021-04-13 ENCOUNTER — Telehealth: Payer: Self-pay | Admitting: Family Medicine

## 2021-04-13 NOTE — Telephone Encounter (Signed)
Patient stated he was speaking with [place] when call disconnected   Please advise

## 2021-04-13 NOTE — Telephone Encounter (Signed)
Call was in regards to wife.

## 2021-04-28 NOTE — Progress Notes (Signed)
[place] at [facility] [address] [place], [place] [address] [phone]  Date:  05/01/2021   Name:  Carl Rivera   DOB:  1980/10/19   MRN:  [id number]  PCP:  Darreld Mclean, MD    Chief Complaint: No chief complaint on file.   History of Present Illness:  Carl Rivera is a 41 y.o. very pleasant male patient who presents with the following:  Pt seen virtually today with concern of a testicular issue Last seen by myself for a physical in November  Connected to pt via video monitor Patient location is his car, my location is office Patient identity confirmed with 2 factors, he gives consent for virtual visit today.  The patient myself are present on the call today  His 40 yo recently had a stomach bug - pt notes he is having diarrhea as well.  This has gone on for about 2 days now, seems to be improving.  He hopes he will be back to normal soon  He has been exercising and trying to lose weight   Main reason for visit today-  He has had a small cyst on his right testes- on the posterior teste.  He had a vasectomy done perhaps 2-3 years ago and his urologist mentioned that at the time The cyst has been there for 7 years or so and is not typically bothered him.  However over the last week to 10 days he has had a and pain in the right testicle -the cyst seems to be the location of his pain He felt like it started getting larger and hurting about a week ago  His urologist is seeing him but his appointment is not for 2 or 3 weeks He is using ibuprofen which makes the pain completely disappear  He seems to recall being diagnosed with epididymitis in the past, the symptoms seem similar  We discussed a more serious issue such as testicular torsion.  Patient notes his pain is not that severe-he does not think torsion is likely  We decided to visit in person on Wednesday-he is not able to get in this afternoon as his daughter has an  appointment However, if getting worse between now and Wednesday he will seek immediate care    Patient Active Problem List   Diagnosis Date Noted   Prediabetes 11/27/2019   Right lower quadrant abdominal pain 09/18/2018   Overweight 12/24/2016   Benign neoplasm of colon 08/05/2013   Family history of malignant neoplasm of gastrointestinal tract 08/05/2013    Past Medical History:  Diagnosis Date   Family history of colon cancer    Localized superficial swelling, mass, or lump    pt unsure what this is.    Past Surgical History:  Procedure Laterality Date   COLONOSCOPY     LAPAROSCOPIC APPENDECTOMY N/A 09/18/2018   Procedure: APPENDECTOMY LAPAROSCOPIC;  Surgeon: Alphonsa Overall, MD;  Location: [facility];  Service: General;  Laterality: N/A;   LIPOMA EXCISION     x2/ on side abdomen   TOE SURGERY  2011   Bil pinkey toe surgery/ removed the bones    Social History   Tobacco Use   Smoking status: Never   Smokeless tobacco: Never  Vaping Use   Vaping Use: Never used  Substance Use Topics   Alcohol use: Yes    Comment: occasional/ twice a month   Drug use: Never    Family History  Problem Relation Age of Onset   Familial polyposis Father        multiple polyps (76-60) ; unknown pathology; no cancer   Cancer Paternal Grandmother 31       colon cancer; breast cancer in 11s   Cancer Paternal Uncle 46       colorectal cancer   Familial polyposis Paternal Uncle 76       polyp history; unknown type or number   Cancer Other 49       1st cousin once removed (pat grandmother's sister's son) with colorectal cancer   Colon polyps Brother    ALS Maternal Grandfather     No Known Allergies  Medication list has been reviewed and updated.  No current outpatient medications on file prior to visit.   No current facility-administered medications on file prior to visit.    Review of Systems:  As per HPI- otherwise negative.   Physical Examination: There were no vitals  filed for this visit. There were no vitals filed for this visit. There is no height or weight on file to calculate BMI. Ideal Body Weight:    Patient observed her video monitor.  He looks his usual self  Assessment and Plan: Pain in right testicle - Plan: levofloxacin (LEVAQUIN) 500 MG tablet  Virtual visit today for testicular pain past 7 to 10 days.  Epididymitis likely diagnosis.  He has called his urologist but they are not able to see him for 2 or 3 weeks He is also not able to get in to the office in person later today, he feels satisfied with an in person visit on Wednesday We will start on Levaquin for presumed epididymitis However, if getting worse he will seek care right away  Signed Lamar Blinks, MD

## 2021-05-01 ENCOUNTER — Telehealth (INDEPENDENT_AMBULATORY_CARE_PROVIDER_SITE_OTHER): Payer: Federal, State, Local not specified - PPO | Admitting: Family Medicine

## 2021-05-01 DIAGNOSIS — N50811 Right testicular pain: Secondary | ICD-10-CM

## 2021-05-01 MED ORDER — LEVOFLOXACIN 500 MG PO TABS
500.0000 mg | ORAL_TABLET | Freq: Every day | ORAL | 0 refills | Status: AC
Start: 2021-05-01 — End: 2021-05-11

## 2021-05-02 NOTE — Progress Notes (Deleted)
[facility] ?[address], [address] ?[place], [place] [address] ?336 [phone] ?Fax [phone] ? ?Date:  05/03/2021  ? ?Name:  Carl Rivera   DOB:  1980/08/16   MRN:  [id number] ? ?PCP:  Darreld Mclean, MD  ? ? ?Chief Complaint: No chief complaint on file. ? ? ?History of Present Illness: ? ?Carl Rivera is a 41 y.o. very pleasant male patient who presents with the following: ? ?Generally healthy man seen today with concern of testicular pain ?We did a virtual visit on Monday at which point he noted a tender mass in the right testicle: ? ?He has had a small cyst on his right testes- on the posterior teste.  He had a vasectomy done perhaps 2-3 years ago and his urologist mentioned that at the time ?The cyst has been there for 7 years or so and is not typically bothered him.  However over the last week to 10 days he has had a and pain in the right testicle -the cyst seems to be the location of his pain ?He felt like it started getting larger and hurting about a week ago  ?His urologist is seeing him but his appointment is not for 2 or 3 weeks ?He is using ibuprofen which makes the pain completely disappear ?He seems to recall being diagnosed with epididymitis in the past, the symptoms seem similar ?  ?We started Levaquin for likely epididymitis and made this appointment ? ?Patient Active Problem List  ? Diagnosis Date Noted  ? Prediabetes 11/27/2019  ? Right lower quadrant abdominal pain 09/18/2018  ? Overweight 12/24/2016  ? Benign neoplasm of colon 08/05/2013  ? Family history of malignant neoplasm of gastrointestinal tract 08/05/2013  ? ? ?Past Medical History:  ?Diagnosis Date  ? Family history of colon cancer   ? Localized superficial swelling, mass, or lump   ? pt unsure what this is.  ? ? ?Past Surgical History:  ?Procedure Laterality Date  ? COLONOSCOPY    ? LAPAROSCOPIC APPENDECTOMY N/A 09/18/2018  ? Procedure: APPENDECTOMY LAPAROSCOPIC;  Surgeon: Alphonsa Overall, MD;   Location: [facility];  Service: General;  Laterality: N/A;  ? LIPOMA EXCISION    ? x2/ on side abdomen  ? TOE SURGERY  2011  ? Bil pinkey toe surgery/ removed the bones  ? ? ?Social History  ? ?Tobacco Use  ? Smoking status: Never  ? Smokeless tobacco: Never  ?Vaping Use  ? Vaping Use: Never used  ?Substance Use Topics  ? Alcohol use: Yes  ?  Comment: occasional/ twice a month  ? Drug use: Never  ? ? ?Family History  ?Problem Relation Age of Onset  ? Familial polyposis Father   ?     multiple polyps (29-60) ; unknown pathology; no cancer  ? Cancer Paternal Grandmother 71  ?     colon cancer; breast cancer in 33s  ? Cancer Paternal Uncle 58  ?     colorectal cancer  ? Familial polyposis Paternal Uncle 25  ?     polyp history; unknown type or number  ? Cancer Other 49  ?     1st cousin once removed (pat grandmother's sister's son) with colorectal cancer  ? Colon polyps Brother   ? ALS Maternal Grandfather   ? ? ?No Known Allergies ? ?Medication list has been reviewed and updated. ? ?Current Outpatient Medications on File Prior to Visit  ?Medication Sig Dispense Refill  ? levofloxacin (  LEVAQUIN) 500 MG tablet Take 1 tablet (500 mg total) by mouth daily for 10 days. 10 tablet 0  ? ?No current facility-administered medications on file prior to visit.  ? ? ?Review of Systems: ? ?As per HPI- otherwise negative. ? ? ?Physical Examination: ?There were no vitals filed for this visit. ?There were no vitals filed for this visit. ?There is no height or weight on file to calculate BMI. ?Ideal Body Weight:   ? ?GEN: no acute distress. ?HEENT: Atraumatic, Normocephalic.  ?Ears and Nose: No external deformity. ?CV: RRR, No M/G/R. No JVD. No thrill. No extra heart sounds. ?PULM: CTA B, no wheezes, crackles, rhonchi. No retractions. No resp. distress. No accessory muscle use. ?ABD: S, NT, ND, +BS. No rebound. No HSM. ?EXTR: No c/c/e ?PSYCH: Normally interactive. Conversant.  ? ? ?Assessment and Plan: ?*** ? ?Signed ?Lamar Blinks,  MD ? ?

## 2021-05-03 ENCOUNTER — Encounter: Payer: Self-pay | Admitting: Family Medicine

## 2021-05-03 ENCOUNTER — Ambulatory Visit: Payer: Federal, State, Local not specified - PPO | Admitting: Family Medicine

## 2021-05-31 DIAGNOSIS — N50819 Testicular pain, unspecified: Secondary | ICD-10-CM | POA: Diagnosis not present

## 2021-05-31 DIAGNOSIS — N451 Epididymitis: Secondary | ICD-10-CM | POA: Diagnosis not present

## 2021-05-31 DIAGNOSIS — N503 Cyst of epididymis: Secondary | ICD-10-CM | POA: Diagnosis not present

## 2021-08-16 ENCOUNTER — Encounter: Payer: Self-pay | Admitting: Family Medicine

## 2021-08-16 DIAGNOSIS — J019 Acute sinusitis, unspecified: Secondary | ICD-10-CM

## 2021-08-16 MED ORDER — AMOXICILLIN 500 MG PO CAPS: 1000.0000 mg | ORAL_CAPSULE | Freq: Two times a day (BID) | ORAL | 0 refills | Status: DC

## 2021-10-11 DIAGNOSIS — D2339 Other benign neoplasm of skin of other parts of face: Secondary | ICD-10-CM | POA: Diagnosis not present

## 2021-10-11 DIAGNOSIS — L821 Other seborrheic keratosis: Secondary | ICD-10-CM | POA: Diagnosis not present

## 2021-11-09 ENCOUNTER — Ambulatory Visit: Payer: Federal, State, Local not specified - PPO | Admitting: Family Medicine

## 2021-11-09 ENCOUNTER — Encounter: Payer: Self-pay | Admitting: Family Medicine

## 2021-11-09 VITALS — BP 141/86 | Temp 98.2°F | Ht 75.0 in | Wt 239.4 lb

## 2021-11-09 DIAGNOSIS — J22 Unspecified acute lower respiratory infection: Secondary | ICD-10-CM | POA: Diagnosis not present

## 2021-11-09 DIAGNOSIS — J4521 Mild intermittent asthma with (acute) exacerbation: Secondary | ICD-10-CM

## 2021-11-09 MED ORDER — BENZONATATE 200 MG PO CAPS
200.0000 mg | ORAL_CAPSULE | Freq: Two times a day (BID) | ORAL | 0 refills | Status: DC | PRN
Start: 2021-11-09 — End: 2021-11-22

## 2021-11-09 MED ORDER — PREDNISONE 20 MG PO TABS: 20.0000 mg | ORAL_TABLET | Freq: Two times a day (BID) | ORAL | 0 refills | Status: AC

## 2021-11-09 MED ORDER — AMOXICILLIN-POT CLAVULANATE 875-125 MG PO TABS: 1.0000 | ORAL_TABLET | Freq: Two times a day (BID) | ORAL | 0 refills | Status: DC

## 2021-11-09 NOTE — Progress Notes (Signed)
Established Patient Office Visit  Subjective   Patient ID: Carl Rivera, male    DOB: 06-Aug-1980  Age: 41 y.o. MRN: [id number]  Chief Complaint  Patient presents with   Cough    Pt c/o coughing x 3 weeks. Shortness of breath and dizziness x 3 days    Cough Associated symptoms include shortness of breath. Pertinent negatives include no chills, eye redness, fever, myalgias or rash.   for evaluation of a 3 to 4-week history of a mostly dry cough occasionally productive of green phlegm.  Temperature has been mildly elevated.  He has felt a burning and tightness in his chest.  He has ongoing postnasal drip.  It has been difficult at times for him to take a full breath.  He has been lightheaded.  He has no history of GERD.  He uses a [employer].  However he had used a different laundry service prior to his symptoms and noted some improvement after he wore clothing that had been cleansed by his primary service.  Also he had developed a rash in his axilla and lower back that had resolved after he switched back to the clothing that had been washed by his usual service.  Work has been stressful for him.  He has no history of hypertension.     I     Review of Systems  Constitutional: Negative.  Negative for chills, fever and malaise/fatigue.  HENT:  Positive for congestion.   Eyes:  Negative for blurred vision, discharge and redness.  Respiratory:  Positive for cough, sputum production and shortness of breath.   Cardiovascular: Negative.   Gastrointestinal:  Negative for abdominal pain.  Genitourinary: Negative.   Musculoskeletal: Negative.  Negative for myalgias.  Skin:  Negative for rash.  Neurological:  Negative for tingling, loss of consciousness and weakness.  Endo/Heme/Allergies:  Negative for polydipsia.      Objective:     BP (!) 141/86   Temp 98.2 F (36.8 C) (Temporal)   Ht '6\' 3"'$  (1.905 m)   Wt 239 lb 6.4 oz (108.6 kg)   BMI 29.92 kg/m    Physical  Exam Constitutional:      General: He is not in acute distress.    Appearance: Normal appearance. He is not ill-appearing, toxic-appearing or diaphoretic.  HENT:     Head: Normocephalic and atraumatic.     Right Ear: Tympanic membrane, ear canal and external ear normal.     Left Ear: Tympanic membrane, ear canal and external ear normal.     Mouth/Throat:     Mouth: Mucous membranes are moist.     Pharynx: Oropharynx is clear. No oropharyngeal exudate or posterior oropharyngeal erythema.  Eyes:     General: No scleral icterus.       Right eye: No discharge.        Left eye: No discharge.     Extraocular Movements: Extraocular movements intact.     Conjunctiva/sclera: Conjunctivae normal.  Cardiovascular:     Rate and Rhythm: Normal rate and regular rhythm.  Pulmonary:     Effort: Pulmonary effort is normal. No respiratory distress.     Breath sounds: Normal breath sounds. No wheezing or rales.  Abdominal:     General: Bowel sounds are normal.  Musculoskeletal:     Cervical back: No rigidity or tenderness.  Skin:    General: Skin is warm and dry.  Neurological:     Mental Status: He is alert and oriented to person, place, and  time.  Psychiatric:        Mood and Affect: Mood normal.        Behavior: Behavior normal.      No results found for any visits on 11/09/21.    The 10-year ASCVD risk score (Arnett DK, et al., 2019) is: 1.7%    Assessment & Plan:   Problem List Items Addressed This Visit   None Visit Diagnoses     Lower respiratory infection    -  Primary   Relevant Medications   amoxicillin-clavulanate (AUGMENTIN) 875-125 MG tablet   Mild intermittent reactive airway disease with acute exacerbation       Relevant Medications   predniSONE (DELTASONE) 20 MG tablet   benzonatate (TESSALON) 200 MG capsule       Return if symptoms worsen or fail to improve.  Has planned follow-up with Dr. Jackie Plum on the 20th. BP recheck at that time.   Libby Maw, MD

## 2021-11-22 ENCOUNTER — Ambulatory Visit
Admission: RE | Admit: 2021-11-22 | Discharge: 2021-11-22 | Disposition: A | Discharge status: Home / Self Care | Payer: Federal, State, Local not specified - PPO | Source: Ambulatory Visit | Attending: Family Medicine | Admitting: Family Medicine

## 2021-11-22 ENCOUNTER — Ambulatory Visit: Payer: Federal, State, Local not specified - PPO | Admitting: Family Medicine

## 2021-11-22 ENCOUNTER — Encounter: Payer: Self-pay | Admitting: Family Medicine

## 2021-11-22 VITALS — BP 144/80 | HR 80 | Temp 97.8°F | Resp 18 | Ht 75.0 in | Wt 240.4 lb

## 2021-11-22 DIAGNOSIS — R7303 Prediabetes: Secondary | ICD-10-CM

## 2021-11-22 DIAGNOSIS — H539 Unspecified visual disturbance: Secondary | ICD-10-CM

## 2021-11-22 DIAGNOSIS — Z87898 Personal history of other specified conditions: Secondary | ICD-10-CM

## 2021-11-22 DIAGNOSIS — R059 Cough, unspecified: Secondary | ICD-10-CM | POA: Diagnosis not present

## 2021-11-22 DIAGNOSIS — R052 Subacute cough: Secondary | ICD-10-CM

## 2021-11-22 DIAGNOSIS — R5383 Other fatigue: Secondary | ICD-10-CM

## 2021-11-22 DIAGNOSIS — R0602 Shortness of breath: Secondary | ICD-10-CM | POA: Diagnosis not present

## 2021-11-22 LAB — CBC
HCT: 43.8 % (ref 39.0–52.0)
Hemoglobin: 15.1 g/dL (ref 13.0–17.0)
MCHC: 34.4 g/dL (ref 30.0–36.0)
MCV: 86.9 fl (ref 78.0–100.0)
Platelets: 145 10*3/uL — ABNORMAL LOW (ref 150.0–400.0)
RBC: 5.04 Mil/uL (ref 4.22–5.81)
RDW: 13.4 % (ref 11.5–15.5)
WBC: 5.6 10*3/uL (ref 4.0–10.5)

## 2021-11-22 LAB — COMPREHENSIVE METABOLIC PANEL
ALT: 23 U/L (ref 0–53)
AST: 15 U/L (ref 0–37)
Albumin: 4.1 g/dL (ref 3.5–5.2)
Alkaline Phosphatase: 51 U/L (ref 39–117)
BUN: 13 mg/dL (ref 6–23)
CO2: 28 mEq/L (ref 19–32)
Calcium: 9.6 mg/dL (ref 8.4–10.5)
Chloride: 105 mEq/L (ref 96–112)
Creatinine, Ser: 0.95 mg/dL (ref 0.40–1.50)
GFR: 100.45 mL/min (ref >60.00)
Glucose, Bld: 99 mg/dL (ref 70–99)
Potassium: 3.9 mEq/L (ref 3.5–5.1)
Sodium: 140 mEq/L (ref 135–145)
Total Bilirubin: 0.5 mg/dL (ref 0.2–1.2)
Total Protein: 6.8 g/dL (ref 6.0–8.3)

## 2021-11-22 LAB — POCT URINALYSIS DIP (MANUAL ENTRY)
Bilirubin, UA: NEGATIVE
Blood, UA: NEGATIVE
Glucose, UA: NEGATIVE mg/dL
Ketones, POC UA: NEGATIVE mg/dL
Leukocytes, UA: NEGATIVE
Nitrite, UA: NEGATIVE
Protein Ur, POC: NEGATIVE mg/dL
Spec Grav, UA: 1.01 (ref 1.010–1.025)
Urobilinogen, UA: 0.2 E.U./dL
pH, UA: 6 (ref 5.0–8.0)

## 2021-11-22 LAB — TROPONIN I (HIGH SENSITIVITY): High Sens Troponin I: 4 ng/L (ref 2–17)

## 2021-11-22 LAB — HEMOGLOBIN A1C: Hgb A1c MFr Bld: 6.1 % (ref 4.6–6.5)

## 2021-11-22 LAB — TSH: TSH: 1.26 u[IU]/mL (ref 0.35–5.50)
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
## 2021-11-22 NOTE — Patient Instructions (Signed)
Good to see you- I will be in touch with your labs and x-ray asap!  If any change or worsening please let me know; if NOT ok please go to the ER Work on getting your eating routine and exercise back to normal as you are able  Take care!

## 2021-11-23 LAB — URINE CULTURE
MICRO NUMBER:: 13944011
Result:: NO GROWTH
SPECIMEN QUALITY:: ADEQUATE

## 2021-11-23 LAB — GLUCOSE, POCT (MANUAL RESULT ENTRY): POC Glucose: 137 mg/dl — AB (ref 70–99)

## 2021-11-24 ENCOUNTER — Encounter: Payer: Self-pay | Admitting: Family Medicine

## 2022-01-05 ENCOUNTER — Ambulatory Visit: Payer: Federal, State, Local not specified - PPO | Admitting: Internal Medicine

## 2022-01-05 ENCOUNTER — Ambulatory Visit (HOSPITAL_BASED_OUTPATIENT_CLINIC_OR_DEPARTMENT_OTHER)
Admission: RE | Admit: 2022-01-05 | Discharge: 2022-01-05 | Disposition: A | Discharge status: Home / Self Care | Payer: Federal, State, Local not specified - PPO | Source: Ambulatory Visit | Attending: Internal Medicine | Admitting: Internal Medicine

## 2022-01-05 DIAGNOSIS — S060XAA Concussion with loss of consciousness status unknown, initial encounter: Secondary | ICD-10-CM | POA: Insufficient documentation

## 2022-01-05 DIAGNOSIS — S0990XA Unspecified injury of head, initial encounter: Secondary | ICD-10-CM | POA: Diagnosis not present
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
## 2022-01-05 NOTE — Patient Instructions (Signed)
We are really CAT scan of your head.  Take ibuprofen over-the-counter  as needed for pain.  Always take it with food because may cause gastritis and ulcers.  If you notice nausea, stomach pain, change in the color of stools --->  Stop the medicine and let us know  If you are not gradually better in the next 7 to 10 days let us know  If you have a major headache, nausea, vomiting, or you are getting worse: Seek medical attention  Please come back and see Dr. Lorelei Pont in 2 weeks   Head Injury, Adult There are many types of head injuries. They can be as minor as a small bump. Some head injuries can be worse. Worse injuries include: A strong hit to the head that shakes the brain back and forth, causing damage (concussion). A bruise (contusion) of the brain. This means there is bleeding in the brain that can cause swelling. A cracked skull (skull fracture). Bleeding in the brain that gathers, gets thick (makes a clot), and forms a bump (hematoma). Most problems from a head injury come in the first 24 hours. However, you may still have side effects up to 7-10 days after your injury. It is important to watch your condition for any changes. You may need to be watched in the emergency department or urgent care, or you may need to stay in the hospital. What are the causes? There are many possible causes of a head injury. A serious head injury may be caused by: A car accident. Bicycle or motorcycle accidents. Sports injuries. Falls. Being hit by an object. What are the signs or symptoms? Symptoms of a head injury include a bruise, bump, or bleeding where the injury happened. Other physical symptoms may include: Headache. Feeling like you may vomit (nauseous) or vomiting. Dizziness. Blurred or double vision. Being uncomfortable around bright lights or loud noises. Shaking movements that you cannot control (seizures). Feeling tired. Trouble being woken up. Fainting or loss of  consciousness. Mental or emotional symptoms may include: Feeling grumpy or cranky. Confusion and memory problems. Having trouble paying attention or concentrating. Changes in eating or sleeping habits. Feeling worried or nervous (anxious). Feeling sad (depressed). How is this treated? Treatment for this condition depends on how severe the injury is and the type of injury you have. The main goal is to prevent problems and to allow the brain time to heal. Mild head injury If you have a mild head injury, you may be sent home, and treatment may include: Being watched. A responsible adult should stay with you for 24 hours after your injury and check on you often. Physical rest. Brain rest. Pain medicines. Severe head injury If you have a severe head injury, treatment may include: Being watched closely. This includes staying in the hospital. Medicines to: Help with pain. Prevent seizures. Help with brain swelling. Protecting your airway and using a machine that helps you breathe (ventilator). Treatments to watch for and manage swelling inside the brain. Brain surgery. This may be needed to: Remove a collection of blood or blood clots. Stop the bleeding. Remove a part of the skull. This allows room for the brain to swell. Follow these instructions at home: Activity Rest. Avoid activities that are hard or tiring. Make sure you get enough sleep. Let your brain rest. Do this by limiting activities that need a lot of thought or attention, such as: Watching TV. Playing memory games and puzzles. Job-related work or homework. Working on [employer], social  media, and texting. Avoid activities that could cause another head injury until your doctor says it is okay. This includes playing sports. Having another head injury, especially before the first one has healed, can be dangerous. Ask your doctor when it is safe for you to go back to your normal activities, such as work or school. Ask your  doctor for a step-by-step plan for slowly going back to your normal activities. Ask your doctor when you can drive, ride a bicycle, or use heavy machinery. Do not do these activities if you are dizzy. Lifestyle  Do not drink alcohol until your doctor says it is okay. Do not use drugs. If it is harder than usual to remember things, write them down. If you are easily distracted, try to do one thing at a time. Talk with family members or close friends when making important decisions. Tell your friends, family, a trusted co-worker, and work Freight forwarder about your injury, symptoms, and limits (restrictions). Have them watch for any problems that are new or getting worse. General instructions Take over-the-counter and prescription medicines only as told by your doctor. Have someone stay with you for 24 hours after your head injury. This person should watch you for any changes in your symptoms and be ready to get help. Keep all follow-up visits as told by your doctor. This is important. How is this prevented? Work on Astron[employer]. This can help you avoid falls. Wear a seat belt when you are in a moving vehicle. Wear a helmet when you: Ride a bicycle. Ski. Do any other sport or activity that has a risk of injury. If you drink alcohol: Limit how much you use to: 0-1 drink a day for nonpregnant women. 0-2 drinks a day for men. Be aware of how much alcohol is in your drink. In the [place] one drink equals one 12 oz bottle of beer (355 mL), one 5 oz glass of wine (148 mL), or one 1 oz glass of hard liquor (44 mL). Make your home safer by: Getting rid of clutter from the floors and stairs. This includes things that can make you trip. Using grab bars in bathrooms and handrails by stairs. Placing non-slip mats on floors and in bathtubs. Putting more light in dim areas. Where to find more information Centers for Disease Control and Prevention: http://www.wolf.info/ Get help right away if: You  have: A very bad headache that is not helped by medicine. Trouble walking or weakness in your arms and legs. Clear or bloody fluid coming from your nose or ears. Changes in how you see (vision). A seizure. More confusion or more grumpy moods. Your symptoms get worse. You are sleepier than normal and have trouble staying awake. You lose your balance. The black centers of your eyes (pupils) change in size. Your speech is slurred. Your dizziness gets worse. You vomit. These symptoms may be an emergency. Do not wait to see if the symptoms will go away. Get medical help right away. Call your local emergency services (911 in the [place] Do not drive yourself to the hospital. Summary Head injuries can be as minor as a small bump. Some head injuries can be worse. Treatment for this condition depends on how severe the injury is and the type of injury you have. Have someone stay with you for 24 hours after your head injury. Ask your doctor when it is safe for you to go back to your normal activities, such as work or school. To prevent a  head injury, wear a seat belt in a car, wear a helmet when you use a bicycle, limit your alcohol use, and make your home safer. This information is not intended to replace advice given to you by your health care provider. Make sure you discuss any questions you have with your health care provider. Document Revised: 01/02/2019 Document Reviewed: 01/02/2019 Elsevier Patient Education  [place].

## 2022-01-09 ENCOUNTER — Ambulatory Visit (HOSPITAL_BASED_OUTPATIENT_CLINIC_OR_DEPARTMENT_OTHER): Payer: Federal, State, Local not specified - PPO

## 2022-01-09 ENCOUNTER — Other Ambulatory Visit (HOSPITAL_BASED_OUTPATIENT_CLINIC_OR_DEPARTMENT_OTHER): Payer: Federal, State, Local not specified - PPO

## 2022-03-08 ENCOUNTER — Encounter: Payer: Self-pay | Admitting: Family Medicine

## 2022-03-08 ENCOUNTER — Telehealth (INDEPENDENT_AMBULATORY_CARE_PROVIDER_SITE_OTHER): Payer: Federal, State, Local not specified - PPO | Admitting: Family Medicine

## 2022-03-08 VITALS — HR 104 | Temp 101.1°F | Ht 75.0 in | Wt 245.1 lb

## 2022-03-08 DIAGNOSIS — J111 Influenza due to unidentified influenza virus with other respiratory manifestations: Secondary | ICD-10-CM

## 2022-03-08 MED ORDER — OSELTAMIVIR PHOSPHATE 75 MG PO CAPS: 75.0000 mg | ORAL_CAPSULE | Freq: Two times a day (BID) | ORAL | 0 refills | Status: DC

## 2022-03-08 NOTE — Progress Notes (Signed)
Virtual Visit via Video Note  I connected with Carl Rivera  on 03/08/22 at  3:00 PM EST by a video enabled telemedicine application and verified that I am speaking with the correct person using two identifiers.  Location patient: [place] Location provider:work or home office Persons participating in the virtual visit: patient, provider  I discussed the limitations and requested verbal permission for telemedicine visit. The patient expressed understanding and agreed to proceed.   HPI:  Acute telemedicine visit for "Flu": -Onset: today -Symptoms include: nasal congestion, cough, body aches -his son diagnosed with flu a few days ago and now his other two kids went to [facility] today and both are positive as well -Denies: CP, SOB, inability to tol fluid, NVD -Has tried: Airborne -Pertinent past medical history: see below, hx prediabetes/ obesity -Pertinent medication allergies:No Known Allergies -COVID-19 vaccine status:  Immunization History  Administered Date(s) Administered   Influenza,inj,Quad PF,6+ Mos 12/24/2016, 01/26/2019, 11/26/2019, 02/01/2021   PFIZER(Purple Top)SARS-COV-2 Vaccination 10/04/2018, 11/04/2018, 12/18/2019   Tdap 12/30/2017     ROS: See pertinent positives and negatives per HPI.  Past Medical History:  Diagnosis Date   Family history of colon cancer    Localized superficial swelling, mass, or lump    pt unsure what this is.    Past Surgical History:  Procedure Laterality Date   COLONOSCOPY     LAPAROSCOPIC APPENDECTOMY N/A 09/18/2018   Procedure: APPENDECTOMY LAPAROSCOPIC;  Surgeon: Alphonsa Overall, MD;  Location: [facility];  Service: General;  Laterality: N/A;   LIPOMA EXCISION     x2/ on side abdomen   TOE SURGERY  2011   Bil pinkey toe surgery/ removed the bones   VASECTOMY       Current Outpatient Medications:    oseltamivir (TAMIFLU) 75 MG capsule, Take 1 capsule (75 mg total) by mouth 2 (two) times daily., Disp: 10 capsule, Rfl: 0  EXAM:  VITALS per patient  if applicable:  GENERAL: alert, oriented, appears well and in no acute distress  HEENT: atraumatic, conjunttiva clear, no obvious abnormalities on inspection of external nose and ears  NECK: normal movements of the head and neck  LUNGS: on inspection no signs of respiratory distress, breathing rate appears normal, no obvious gross SOB, gasping or wheezing  CV: no obvious cyanosis  MS: moves all visible extremities without noticeable abnormality  PSYCH/NEURO: pleasant and cooperative, no obvious depression or anxiety, speech and thought processing grossly intact  ASSESSMENT AND PLAN:  Discussed the following assessment and plan:  Influenza-like illness  - Pt is agreeable to treatment via telemedicine at this moment. He feels has the flu and is requesting tamiflu. Flu most likely given several family members with this. Discuss indications, risks with trx. He opted for empiric tamiflu.  Advised to seek prompt virtual visit or in person care if worsening, new symptoms arise, or if is not improving with treatment as expected per our conversation of expected course. Discussed options for follow up care. Did let this patient know that I do telemedicine on Tuesdays and Thursdays for [place].   I discussed the assessment and treatment plan with the patient. The patient was provided an opportunity to ask questions and all were answered. The patient agreed with the plan and demonstrated an understanding of the instructions.     Lucretia Kern, DO

## 2022-03-08 NOTE — Patient Instructions (Signed)
-  I sent the medication(s) we discussed to your pharmacy: Meds ordered this encounter  Medications   oseltamivir (TAMIFLU) 75 MG capsule    Sig: Take 1 capsule (75 mg total) by mouth 2 (two) times daily.    Dispense:  10 capsule    Refill:  0     I hope you are feeling better soon!  Seek in person care promptly if your symptoms worsen, new concerns arise or you are not improving with treatment.  It was nice to meet you today. I help [facility] out with telemedicine visits on Tuesdays and Thursdays and am happy to help if you need a virtual follow up visit on those days. Otherwise, if you have any concerns or questions following this visit please schedule a follow up visit with your Primary Care office or seek care at a local urgent care clinic to avoid delays in care. If you are having severe or life threatening symptoms please call 911 and/or go to the nearest emergency room.

## 2022-07-06 DIAGNOSIS — F4320 Adjustment disorder, unspecified: Secondary | ICD-10-CM | POA: Diagnosis not present

## 2022-07-06 DIAGNOSIS — Z630 Problems in relationship with spouse or partner: Secondary | ICD-10-CM | POA: Diagnosis not present

## 2022-07-27 DIAGNOSIS — Z630 Problems in relationship with spouse or partner: Secondary | ICD-10-CM | POA: Diagnosis not present

## 2022-07-27 DIAGNOSIS — F4320 Adjustment disorder, unspecified: Secondary | ICD-10-CM | POA: Diagnosis not present

## 2022-08-16 ENCOUNTER — Ambulatory Visit (INDEPENDENT_AMBULATORY_CARE_PROVIDER_SITE_OTHER): Payer: Federal, State, Local not specified - PPO | Admitting: Family Medicine

## 2022-08-16 ENCOUNTER — Encounter: Payer: Self-pay | Admitting: Family Medicine

## 2022-08-16 VITALS — BP 135/82 | HR 87 | Temp 97.7°F | Resp 18 | Ht 75.0 in | Wt 247.8 lb

## 2022-08-16 DIAGNOSIS — R0683 Snoring: Secondary | ICD-10-CM

## 2022-08-16 DIAGNOSIS — Z13220 Encounter for screening for lipoid disorders: Secondary | ICD-10-CM

## 2022-08-16 DIAGNOSIS — Z8042 Family history of malignant neoplasm of prostate: Secondary | ICD-10-CM | POA: Diagnosis not present

## 2022-08-16 DIAGNOSIS — R7303 Prediabetes: Secondary | ICD-10-CM

## 2022-08-16 DIAGNOSIS — Z0000 Encounter for general adult medical examination without abnormal findings: Secondary | ICD-10-CM

## 2022-08-16 DIAGNOSIS — Z125 Encounter for screening for malignant neoplasm of prostate: Secondary | ICD-10-CM

## 2022-08-16 DIAGNOSIS — F4320 Adjustment disorder, unspecified: Secondary | ICD-10-CM | POA: Diagnosis not present

## 2022-08-16 DIAGNOSIS — Z630 Problems in relationship with spouse or partner: Secondary | ICD-10-CM | POA: Diagnosis not present

## 2022-08-16 DIAGNOSIS — Z130 Encounter for screening for diseases of the blood and blood-forming organs and certain disorders involving the immune mechanism: Secondary | ICD-10-CM

## 2022-08-16 NOTE — Patient Instructions (Signed)
It was good to see you today- I hope things turn out better than expected with your dad, please let me know what I can do to help I will be in touch with your labs asap

## 2022-08-16 NOTE — Progress Notes (Addendum)
[facility] [address], [address] [place], [place] [address] [phone]  Date:  08/16/2022   Name:  Carl Rivera   DOB:  1980/06/23   MRN:  [id number]  PCP:  Pearline Cables, MD    Chief Complaint: Annual Exam (Concerns/ questions: none/HIV screen due)   History of Present Illness:  Carl Rivera is a 42 y.o. 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  We wonder if he might have sleep apnea-his wife does complain of snoring.  However, he notes he sometimes will work extremely long days and might only sleep 3 or 4 hours at night which certainly could also contribute  Lab Results  Component Value Date   HGBA1C 6.1 11/22/2021    Patient Active Problem List   Diagnosis Date Noted   Prediabetes 11/27/2019   Right lower quadrant abdominal pain 09/18/2018   Overweight 12/24/2016   Benign neoplasm of colon 08/05/2013   Family history of malignant neoplasm of gastrointestinal tract 08/05/2013    Past Medical History:  Diagnosis Date   Family history of colon cancer    Localized superficial swelling, mass, or lump    pt unsure what this is.    Past Surgical History:  Procedure Laterality Date   COLONOSCOPY     LAPAROSCOPIC APPENDECTOMY N/A 09/18/2018    Procedure: APPENDECTOMY LAPAROSCOPIC;  Surgeon: Ovidio Kin, MD;  Location: [facility];  Service: General;  Laterality: N/A;   LIPOMA EXCISION     x2/ on side abdomen   TOE SURGERY  2011   Bil pinkey toe surgery/ removed the bones   VASECTOMY      Social History   Tobacco Use   Smoking status: Never   Smokeless tobacco: Never  Vaping Use   Vaping Use: Never used  Substance Use Topics   Alcohol use: Yes    Comment: occasional/ twice a month   Drug use: Never    Family History  Problem Relation Age of Onset   Familial polyposis Father        multiple polyps (50-60) ; unknown pathology; no cancer   Cancer Paternal Grandmother 33       colon cancer; breast cancer in 26s   Cancer Paternal Uncle 55       colorectal cancer   Familial polyposis Paternal Uncle 53       polyp history; unknown type or number   Cancer Other 40       1st cousin once removed (pat grandmother's sister's son) with colorectal cancer   Colon polyps Brother    ALS Maternal Grandfather     No Known Allergies  Medication  list has been reviewed and updated.  No current outpatient medications on file prior to visit.   No current facility-administered medications on file prior to visit.    Review of Systems:  As per HPI- otherwise negative.   Physical Examination: Vitals:   08/16/22 1420 08/16/22 1503  BP: (!) 140/82 135/82  Pulse: 87   Resp: 18   Temp: 97.7 F (36.5 C)   SpO2: 97%    Vitals:   08/16/22 1420  Weight: 247 lb 12.8 oz (112.4 kg)  Height: 6\' 3"  (1.905 m)   Body mass index is 30.97 kg/m. Ideal Body Weight: Weight in (lb) to have BMI = 25: 199.6  GEN: no acute distress.  Overweight, looks well HEENT: Atraumatic, Normocephalic.  Bilateral TM wnl, oropharynx normal.  PEERL,EOMI.   Ears and Nose: No external deformity. CV: RRR, No M/G/R. No JVD. No thrill. No extra heart sounds. PULM: CTA B, no wheezes, crackles, rhonchi. No retractions. No resp. distress. No accessory muscle  use. ABD: S, NT, ND, +BS. No rebound. No HSM. EXTR: No c/c/e PSYCH: Normally interactive. Conversant.    Assessment and Plan: Physical exam  Pre-diabetes - Plan: Comprehensive metabolic panel, Hemoglobin A1c  Screening for hyperlipidemia - Plan: Lipid panel  Screening for deficiency anemia - Plan: CBC  Screening for prostate cancer - Plan: PSA  Family history of prostate cancer - Plan: PSA  Snoring - Plan: Ambulatory referral to Neurology  Patient in today for physical exam.  Completed physical form for scout camp Encouraged healthy diet and exercise routine Will plan further follow- up pending labs. Will start PSA screening as his father was diagnosed with prostate cancer Referral to neurology for sleep study at his convenience Signed Abbe Amsterdam, MD  Received labs as below- message to pt 6/14  Results for orders placed or performed in visit on 08/16/22  CBC  Result Value Ref Range   WBC 7.2 4.0 - 10.5 K/uL   RBC 5.11 4.22 - 5.81 Mil/uL   Platelets 203.0 150.0 - 400.0 K/uL   Hemoglobin 15.1 13.0 - 17.0 g/dL   HCT 16.1 09.6 - 04.5 %   MCV 87.7 78.0 - 100.0 fl   MCHC 33.8 30.0 - 36.0 g/dL   RDW 40.9 81.1 - 91.4 %  Comprehensive metabolic panel  Result Value Ref Range   Sodium 140 135 - 145 mEq/L   Potassium 3.8 3.5 - 5.1 mEq/L   Chloride 102 96 - 112 mEq/L   CO2 28 19 - 32 mEq/L   Glucose, Bld 89 70 - 99 mg/dL   BUN 16 6 - 23 mg/dL   Creatinine, Ser 7.82 0.40 - 1.50 mg/dL   Total Bilirubin 0.5 0.2 - 1.2 mg/dL   Alkaline Phosphatase 56 39 - 117 U/L   AST 18 0 - 37 U/L   ALT 31 0 - 53 U/L   Total Protein 7.1 6.0 - 8.3 g/dL   Albumin 4.4 3.5 - 5.2 g/dL   GFR 95.62 >13.08 mL/min   Calcium 9.2 8.4 - 10.5 mg/dL  Hemoglobin M5H  Result Value Ref Range   Hgb A1c MFr Bld 6.0 4.6 - 6.5 %  Lipid panel  Result Value Ref Range   Cholesterol 174 0 - 200 mg/dL   Triglycerides 846.9 (H) 0.0 - 149.0 mg/dL   HDL 62.95 (L) >28.41 mg/dL   VLDL 32.4 (H) 0.0 - 40.1  mg/dL   Total CHOL/HDL Ratio 5    NonHDL 137.25   PSA  Result Value Ref  Range   PSA 0.80 0.10 - 4.00 ng/mL  LDL cholesterol, direct  Result Value Ref Range   Direct LDL 115.0 mg/dL

## 2022-08-17 ENCOUNTER — Encounter: Payer: Self-pay | Admitting: Family Medicine

## 2022-08-17 LAB — CBC
HCT: 44.8 % (ref 39.0–52.0)
Hemoglobin: 15.1 g/dL (ref 13.0–17.0)
MCHC: 33.8 g/dL (ref 30.0–36.0)
MCV: 87.7 fl (ref 78.0–100.0)
Platelets: 203 10*3/uL (ref 150.0–400.0)
RBC: 5.11 Mil/uL (ref 4.22–5.81)
RDW: 13.1 % (ref 11.5–15.5)
WBC: 7.2 10*3/uL (ref 4.0–10.5)

## 2022-08-17 LAB — LIPID PANEL
Cholesterol: 174 mg/dL (ref 0–200)
HDL: 36.5 mg/dL — ABNORMAL LOW (ref >39.00)
NonHDL: 137.25
Total CHOL/HDL Ratio: 5
Triglycerides: 211 mg/dL — ABNORMAL HIGH (ref 0.0–149.0)
VLDL: 42.2 mg/dL — ABNORMAL HIGH (ref 0.0–40.0)

## 2022-08-17 LAB — COMPREHENSIVE METABOLIC PANEL
ALT: 31 U/L (ref 0–53)
AST: 18 U/L (ref 0–37)
Albumin: 4.4 g/dL (ref 3.5–5.2)
Alkaline Phosphatase: 56 U/L (ref 39–117)
BUN: 16 mg/dL (ref 6–23)
CO2: 28 mEq/L (ref 19–32)
Calcium: 9.2 mg/dL (ref 8.4–10.5)
Chloride: 102 mEq/L (ref 96–112)
Creatinine, Ser: 1.23 mg/dL (ref 0.40–1.50)
GFR: 73.3 mL/min (ref >60.00)
Glucose, Bld: 89 mg/dL (ref 70–99)
Potassium: 3.8 mEq/L (ref 3.5–5.1)
Sodium: 140 mEq/L (ref 135–145)
Total Bilirubin: 0.5 mg/dL (ref 0.2–1.2)
Total Protein: 7.1 g/dL (ref 6.0–8.3)

## 2022-08-17 LAB — LDL CHOLESTEROL, DIRECT: Direct LDL: 115 mg/dL

## 2022-08-17 LAB — HEMOGLOBIN A1C: Hgb A1c MFr Bld: 6 % (ref 4.6–6.5)

## 2022-08-17 LAB — PSA: PSA: 0.8 ng/mL (ref 0.10–4.00)

## 2022-08-20 ENCOUNTER — Ambulatory Visit: Payer: Federal, State, Local not specified - PPO | Admitting: Family Medicine

## 2022-08-20 VITALS — BP 120/78 | HR 81 | Temp 98.6°F | Ht 75.0 in | Wt 248.1 lb

## 2022-08-20 DIAGNOSIS — J208 Acute bronchitis due to other specified organisms: Secondary | ICD-10-CM | POA: Diagnosis not present

## 2022-08-20 DIAGNOSIS — B9689 Other specified bacterial agents as the cause of diseases classified elsewhere: Secondary | ICD-10-CM

## 2022-08-20 LAB — POC COVID19 BINAXNOW: SARS Coronavirus 2 Ag: NEGATIVE

## 2022-08-20 MED ORDER — AZITHROMYCIN 250 MG PO TABS: ORAL_TABLET | ORAL | 0 refills | Status: DC

## 2022-08-20 MED ORDER — HYDROCODONE BIT-HOMATROP MBR 5-1.5 MG/5ML PO SOLN: 5.0000 mL | Freq: Three times a day (TID) | ORAL | 0 refills | Status: DC | PRN

## 2022-08-20 NOTE — Patient Instructions (Signed)
Continue to push fluids, practice good hand hygiene, and cover your mouth if you cough. ? ?If you start having fevers, shaking or shortness of breath, seek immediate care. ? ?OK to take Tylenol 1000 mg (2 extra strength tabs) or 975 mg (3 regular strength tabs) every 6 hours as needed. ? ?Let us know if you need anything. ?

## 2022-08-20 NOTE — Progress Notes (Signed)
Chief Complaint  Patient presents with   Cough    Feels bad Fatigue Unable to sleep well    Carl Rivera here for URI complaints.  Duration: 2 weeks; improving and got worse 6/15 Associated symptoms: subjective fever, sinus congestion, rhinorrhea, productive cough (green), and myalgia Denies: sinus pain, itchy watery eyes, ear pain, ear drainage, sore throat, wheezing, and shortness of breath Treatment to date: cough drops, Nyquil Sick contacts: Yes, was here 4 d ago and his doctor was sick; has been dx'd w covid since then  Past Medical History:  Diagnosis Date   Family history of colon cancer    Localized superficial swelling, mass, or lump    pt unsure what this is.    Objective BP 120/78 (BP Location: Left Arm, Patient Position: Sitting, Cuff Size: Large)   Pulse 81   Temp 98.6 F (37 C) (Oral)   Ht 6\' 3"  (1.905 m)   Wt 248 lb 2 oz (112.5 kg)   SpO2 94%   BMI 31.01 kg/m  General: Awake, alert, appears stated age HEENT: AT, [place], ears patent b/l and TM's neg, nares patent w/o discharge, pharynx pink and without exudates, MMM Neck: No masses or asymmetry Heart: RRR Lungs: CTAB, no accessory muscle use Psych: Age appropriate judgment and insight, normal mood and affect  Acute bacterial bronchitis - Plan: HYDROcodone bit-homatropine (HYCODAN) 5-1.5 MG/5ML syrup, azithromycin (ZITHROMAX) 250 MG tablet, POC COVID-19  Tested neg for covid. Getting worse. Zpak, syrup as above. Warnings about syrup verbalized. Continue to push fluids, practice good hand hygiene, cover mouth when coughing. F/u prn. If starting to experience worsening s/s's, shaking, or shortness of breath, seek immediate care. Pt voiced understanding and agreement to the plan.  Jilda Roche [place], DO 08/20/22 3:39 PM

## 2022-08-28 DIAGNOSIS — Z630 Problems in relationship with spouse or partner: Secondary | ICD-10-CM | POA: Diagnosis not present

## 2022-08-28 DIAGNOSIS — F4320 Adjustment disorder, unspecified: Secondary | ICD-10-CM | POA: Diagnosis not present

## 2022-09-18 DIAGNOSIS — F4320 Adjustment disorder, unspecified: Secondary | ICD-10-CM | POA: Diagnosis not present

## 2022-09-18 DIAGNOSIS — Z630 Problems in relationship with spouse or partner: Secondary | ICD-10-CM | POA: Diagnosis not present

## 2022-09-20 ENCOUNTER — Ambulatory Visit: Payer: Federal, State, Local not specified - PPO | Admitting: Neurology

## 2022-09-20 ENCOUNTER — Encounter: Payer: Self-pay | Admitting: Neurology

## 2022-09-20 VITALS — BP 119/71 | HR 72 | Ht 74.0 in | Wt 246.0 lb

## 2022-09-20 DIAGNOSIS — Z9189 Other specified personal risk factors, not elsewhere classified: Secondary | ICD-10-CM

## 2022-09-20 DIAGNOSIS — G4719 Other hypersomnia: Secondary | ICD-10-CM

## 2022-09-20 DIAGNOSIS — E66811 Obesity, class 1: Secondary | ICD-10-CM

## 2022-09-20 DIAGNOSIS — R0683 Snoring: Secondary | ICD-10-CM

## 2022-09-20 DIAGNOSIS — E669 Obesity, unspecified: Secondary | ICD-10-CM

## 2022-09-20 DIAGNOSIS — R0681 Apnea, not elsewhere classified: Secondary | ICD-10-CM

## 2022-09-20 DIAGNOSIS — R351 Nocturia: Secondary | ICD-10-CM

## 2022-09-20 NOTE — Patient Instructions (Signed)

## 2022-09-20 NOTE — Progress Notes (Signed)
Subjective:    Patient ID: Carl Rivera is a 42 y.o. male.  HPI    Huston Foley, MD, PhD [facility] [address] [address] [place], [place] [address]  Dear Shanda Bumps,  I saw your patient, Carl Rivera, upon your kind as did my sleep clinic today for initial consultation of his sleep disorder, in particular, concern for underlying obstructive sleep apnea.  The patient is unaccompanied today.  As you know, Mr. Carl Rivera is a 42 year old male with an underlying medical history of prediabetes, and mild obesity, who reports snoring and sleep disruption as well as daytime somnolence and witnessed apneas per spouse's observation.  His Epworth sleepiness score is 6 out of 24, fatigue severity score is 41 out of 63.  He has had weight fluctuation, may fluctuate 20 pounds up or down.  I reviewed your office note from 08/16/2022.  His bedtime is generally around 10 PM and he is typically asleep by 10:30 PM or 11.  Rise time is 6:45 AM currently, usually 5:45 AM during the school year.  He lives with his wife and 4 children, ages 27, 63, 60, and 41.  They have no pets in the household.  They do have a TV in the bedroom but do not usually watch it at night.  He drinks quite a bit of caffeine in the form of soda, usually in 32 ounces per day, has actually cut back some.  He does not drink much in the way of coffee or tea.  He works as a Clinical research associate, Research scientist (medical).  He is a non-smoker and drinks alcohol infrequently, maybe once a month.  He has nocturia about once or twice per average night, denies nocturnal or morning headaches.  Snoring can be disturbing to his wife.  According to his smart phone, on a sleep tracker app, he also has nocturnal cough.  His Past Medical History Is Significant For: Past Medical History:  Diagnosis Date   Family history of colon cancer    Localized superficial swelling, mass, or lump    pt unsure what this is.    His Past Surgical History Is  Significant For: Past Surgical History:  Procedure Laterality Date   COLONOSCOPY     LAPAROSCOPIC APPENDECTOMY N/A 09/18/2018   Procedure: APPENDECTOMY LAPAROSCOPIC;  Surgeon: Ovidio Kin, MD;  Location: [facility];  Service: General;  Laterality: N/A;   LIPOMA EXCISION     x2/ on side abdomen   TOE SURGERY  2011   Bil pinkey toe surgery/ removed the bones   VASECTOMY      His Family History Is Significant For: Family History  Problem Relation Age of Onset   Familial polyposis Father        multiple polyps (50-60) ; unknown pathology; no cancer   Insomnia Father    Colon polyps Brother    Cancer Paternal Uncle 13       colorectal cancer   Familial polyposis Paternal Uncle 70       polyp history; unknown type or number   ALS Maternal Grandfather    Cancer Paternal Grandmother 104       colon cancer; breast cancer in 56s   Sleep apnea Paternal Grandfather    Cancer Other 11       1st cousin once removed (pat grandmother's sister's son) with colorectal cancer    His Social History Is Significant For: Social History   Socioeconomic History   Marital status: Married  Spouse name: Not on file   Number of children: Not on file   Years of education: Not on file   Highest education level: Professional school degree (e.g., MD, DDS, DVM, JD)  Occupational History   Not on file  Tobacco Use   Smoking status: Never   Smokeless tobacco: Never  Vaping Use   Vaping status: Never Used  Substance and Sexual Activity   Alcohol use: Yes    Comment: occasional   Drug use: Never   Sexual activity: Not on file  Other Topics Concern   Not on file  Social History Narrative   Not on file   Social Determinants of Health   Financial Resource Strain: Low Risk  (08/20/2022)   Overall Financial Resource Strain (CARDIA)    Difficulty of Paying Living Expenses: Not hard at all  Food Insecurity: No Food Insecurity (08/20/2022)   Hunger Vital Sign    Worried About Running Out of Food in  the Last Year: Never true    Ran Out of Food in the Last Year: Never true  Transportation Needs: No Transportation Needs (08/20/2022)   PRAPARE - Administrator, Civil Service (Medical): No    Lack of Transportation (Non-Medical): No  Physical Activity: Sufficiently Active (08/20/2022)   Exercise Vital Sign    Days of Exercise per Week: 4 days    Minutes of Exercise per Session: 60 min  Stress: Stress Concern Present (08/20/2022)   [employer] of Occupational Health - Occupational Stress Questionnaire    Feeling of Stress : Rather much  Social Connections: Socially Integrated (08/20/2022)   Social Connection and Isolation Panel [NHANES]    Frequency of Communication with Friends and Family: More than three times a week    Frequency of Social Gatherings with Friends and Family: More than three times a week    Attends Religious Services: 1 to 4 times per year    Active Member of [employer] or Organizations: Yes    Attends Engineer, structural: More than 4 times per year    Marital Status: Married    His Allergies Are:  No Known Allergies:   His Current Medications Are:  Outpatient Encounter Medications as of 09/20/2022  Medication Sig   azithromycin (ZITHROMAX) 250 MG tablet Take 2 tabs the first day and then 1 tab daily until you run out.   HYDROcodone bit-homatropine (HYCODAN) 5-1.5 MG/5ML syrup Take 5 mLs by mouth every 8 (eight) hours as needed for cough.   No facility-administered encounter medications on file as of 09/20/2022.  :   Review of Systems:  Out of a complete 14 point review of systems, all are reviewed and negative with the exception of these symptoms as listed below:  Review of Systems  Neurological:        Pt here for sleep consult Pt snores.fatigue ,hypertension Pt denies sleep study, CPAP machine ,headaches    ESS:6 FSS:41     Objective:  Neurological Exam  Physical Exam Physical Examination:   Vitals:   09/20/22 1357  BP:  119/71  Pulse: 72    General Examination: The patient is a very pleasant 42 y.o. male in no acute distress. He appears well-developed and well-nourished and well groomed.   HEENT: Normocephalic, atraumatic, pupils are equal, round and reactive to light, extraocular tracking is good without limitation to gaze excursion or nystagmus noted. Hearing is grossly intact. Face is symmetric with normal facial animation. Speech is clear with no dysarthria noted. There is  no hypophonia. There is no lip, neck/head, jaw or voice tremor. Neck is supple with full range of passive and active motion. There are no carotid bruits on auscultation. Oropharynx exam reveals: mild mouth dryness, good dental hygiene and moderate airway crowding, due to redundant soft palate and larger uvula, tonsils absent with possible residual tonsillar tissue on the left.  Tongue protrudes centrally and palate elevates symmetrically, neck circumference 17-3/8 inches.  Minimal overbite noted.  Chest: Clear to auscultation without wheezing, rhonchi or crackles noted.  Heart: S1+S2+0, regular and normal without murmurs, rubs or gallops noted.   Abdomen: Soft, non-tender and non-distended.  Extremities: There is no pitting edema in the distal lower extremities bilaterally.   Skin: Warm and dry without trophic changes noted.   Musculoskeletal: exam reveals no obvious joint deformities, slightly wider toe on the left.   Neurologically:  Mental status: The patient is awake, alert and oriented in all 4 spheres. His immediate and remote memory, attention, language skills and fund of knowledge are appropriate. There is no evidence of aphasia, agnosia, apraxia or anomia. Speech is clear with normal prosody and enunciation. Thought process is linear. Mood is normal and affect is normal.  Cranial nerves II - XII are as described above under HEENT exam.  Motor exam: Normal bulk, strength and tone is noted. There is no obvious action or resting  tremor.  Fine motor skills and coordination: grossly intact.  Cerebellar testing: No dysmetria or intention tremor. There is no truncal or gait ataxia.  Sensory exam: intact to light touch in the upper and lower extremities.  Gait, station and balance: He stands easily. No veering to one side is noted. No leaning to one side is noted. Posture is age-appropriate and stance is narrow based. Gait shows normal stride length and normal pace. No problems turning are noted.   Assessment and plan:  In summary, MARKEL KURTENBACH is a very pleasant 42 y.o.-year old male with an underlying medical history of prediabetes, and mild obesity, whose history and physical exam are concerning for sleep disordered breathing, particularly obstructive sleep apnea (OSA).  While a laboratory attended sleep study is typically considered "gold standard" for evaluation of sleep disordered breathing, we mutually agreed to pursue a home sleep test at this time.   I had a long chat with the patient about my findings and the diagnosis of sleep apnea, particularly OSA, its prognosis and treatment options. We talked about medical/conservative treatments, surgical interventions and non-pharmacological approaches for symptom control. I explained, in particular, the risks and ramifications of untreated moderate to severe OSA, especially with respect to developing cardiovascular disease down the road, including congestive heart failure (CHF), difficult to treat hypertension, cardiac arrhythmias (particularly A-fib), neurovascular complications including TIA, stroke and dementia. Even type 2 diabetes has, in part, been linked to untreated OSA. Symptoms of untreated OSA may include (but may not be limited to) daytime sleepiness, nocturia (i.e. frequent nighttime urination), memory problems, mood irritability and suboptimally controlled or worsening mood disorder such as depression and/or anxiety, lack of energy, lack of motivation, physical  discomfort, as well as recurrent headaches, especially morning or nocturnal headaches. We talked about the importance of maintaining a healthy lifestyle and striving for healthy weight. In addition, we talked about the importance of striving for and maintaining good sleep hygiene. I recommended a sleep study at this time. I outlined the differences between a laboratory attended sleep study which is considered more comprehensive and accurate over the option of a home  sleep test (HST); the latter may lead to underestimation of sleep disordered breathing in some instances and does not help with diagnosing upper airway resistance syndrome and is not accurate enough to diagnose primary central sleep apnea typically. I outlined possible surgical and non-surgical treatment options of OSA, including the use of a positive airway pressure (PAP) device (i.e. CPAP, AutoPAP/APAP or BiPAP in certain circumstances), a custom-made dental device (aka oral appliance, which would require a referral to a specialist dentist or orthodontist typically, and is generally speaking not considered for patients with full dentures or edentulous state), upper airway surgical options, such as traditional UPPP (which is not considered a first-line treatment) or the Inspire device (hypoglossal nerve stimulator, which would involve a referral for consultation with an ENT surgeon, after careful selection, following inclusion criteria - also not first-line treatment). I explained the PAP treatment option to the patient in detail, as this is generally considered first-line treatment.  The patient indicated that he would be willing to try PAP therapy, if the need arises. I explained the importance of being compliant with PAP treatment, not only for insurance purposes but primarily to improve patient's symptoms symptoms, and for the patient's long term health benefit, including to reduce His cardiovascular risks longer-term.    We will pick up our  discussion about the next steps and treatment options after testing.  We will keep him posted as to the test results by phone call and/or MyChart messaging where possible.  We will plan to follow-up in sleep clinic accordingly as well.  I answered all his questions today and the patient was in agreement.   I encouraged him to call with any interim questions, concerns, problems or updates or email [place] through MyChart.  Generally speaking, sleep test authorizations may take up to 2 weeks, sometimes less, sometimes longer, the patient is encouraged to get in touch with [place] if they do not hear back from the sleep lab staff directly within the next 2 weeks.  Thank you very much for allowing me to participate in the care of this nice patient. If I can be of any further assistance to you please do not hesitate to call me at [phone].  Sincerely,   Huston Foley, MD, PhD

## 2022-10-04 DIAGNOSIS — Z630 Problems in relationship with spouse or partner: Secondary | ICD-10-CM | POA: Diagnosis not present

## 2022-10-04 DIAGNOSIS — F4320 Adjustment disorder, unspecified: Secondary | ICD-10-CM | POA: Diagnosis not present

## 2022-10-09 ENCOUNTER — Ambulatory Visit: Payer: Federal, State, Local not specified - PPO | Admitting: Neurology

## 2022-10-09 DIAGNOSIS — R351 Nocturia: Secondary | ICD-10-CM

## 2022-10-09 DIAGNOSIS — G4733 Obstructive sleep apnea (adult) (pediatric): Secondary | ICD-10-CM | POA: Diagnosis not present

## 2022-10-09 DIAGNOSIS — G4719 Other hypersomnia: Secondary | ICD-10-CM

## 2022-10-09 DIAGNOSIS — R0681 Apnea, not elsewhere classified: Secondary | ICD-10-CM

## 2022-10-09 DIAGNOSIS — R0683 Snoring: Secondary | ICD-10-CM

## 2022-10-09 DIAGNOSIS — E669 Obesity, unspecified: Secondary | ICD-10-CM

## 2022-10-09 DIAGNOSIS — Z9189 Other specified personal risk factors, not elsewhere classified: Secondary | ICD-10-CM

## 2022-10-10 ENCOUNTER — Telehealth: Payer: Self-pay

## 2022-10-10 NOTE — Telephone Encounter (Signed)
Pt completed a home sleep study last night but did not have enough recording/sleep time (test started at 10:23PM and ended at 2:52AM, only 2hrs sleep time). I called the patient to explain that he would need to repeat the HST due to there not being enough recording or sleep time. Patient expressed that the HST was extremely uncomfortable and hurt his finger and he did not wish to repeat it. I did offer the potential for an in lab study but relayed that he would have more wires on for that and would still have to wear a probe on his finger. Pt did not wish to reschedule HST or try for in lab. I let the patient know that I was not sure we had any alternatives to the HST or In Lab but would relay all of this to Dr. Frances Furbish. Pt was fine with letting the provider know but continued to emphasize that he did not wish to repeat the study and it was extremely uncomfortable.   Study does have some data, not sure if enough to read. Will give study to Dr. Frances Furbish to determine - will likely need to do a modified charge for HST since patient did complete it but willingly removed the study before the 7 hours was complete and does not wish to repeat it.

## 2022-10-17 NOTE — Progress Notes (Signed)
See procedure note.

## 2022-10-19 NOTE — Procedures (Signed)
[facility]  HOME SLEEP TEST (Watch PAT) REPORT  STUDY DATE: 10/09/2022  DOB: 1981-01-13  MRN: [id number]  ORDERING CLINICIAN: Huston Foley, MD, PhD   REFERRING CLINICIAN: Copland, Gwenlyn Found, MD   CLINICAL INFORMATION/HISTORY: 42 year old male with an underlying medical history of prediabetes, and mild obesity, who reports snoring and sleep disruption as well as daytime somnolence and witnessed apneas.    Epworth sleepiness score: 6/24.  BMI: 31.7 kg/m  FINDINGS:   Sleep Summary:   Total Recording Time (hours, min): 4 hours, 29 min  Total Sleep Time (hours, min):  2 hours, 58 min  Percent REM (%):    4.2%   Respiratory Indices:   Calculated pAHI (per hour):  10.3/hour         REM pAHI:    N/A       NREM pAHI: 10.3/hour  Central pAHI: 0/hour  Oxygen Saturation Statistics:    Oxygen Saturation (%) Mean: 93%   Minimum oxygen saturation (%):                 91%   O2 Saturation Range (%): 91-96%    O2 Saturation (minutes) <=88%: 0 min  Pulse Rate Statistics:   Pulse Mean (bpm):    53/min    Pulse Range (43-77/min)   IMPRESSION: OSA (obstructive sleep apnea) Suboptimal study   RECOMMENDATION:  This home sleep test demonstrates overall mild obstructive sleep apnea.  Please note that this study is limited secondary to a low total recording time of less than 6 hours and and low total sleep time of less than 3 hours.  This may underestimate or overestimate his sleep disordered breathing.  Intermittent mild to moderate snoring was detected.  Based on the current test results the patient has mild obstructive sleep apnea but there may be low or lack of REM sleep during the study, leading to an underestimation of his obstructive sleep apnea including his AHI and O2 nadir. The patient was offered a repeat home sleep test which he declined.  He was also advised that an in lab sleep study could be pursued but he did not wish to proceed with an in lab  sleep study.  Based on the test results, AutoPap therapy will be offered to the patient treatment options for mild obstructive sleep apnea. Alternative treatments may include weight loss (where appropriate) along with avoidance of the supine sleep position (if possible), or an oral appliance in appropriate candidates.   Please note that untreated obstructive sleep apnea may carry additional perioperative morbidity. Patients with significant obstructive sleep apnea should receive perioperative PAP therapy and the surgeons and particularly the anesthesiologist should be informed of the diagnosis and the severity of the sleep disordered breathing. The patient should be cautioned not to drive, work at heights, or operate dangerous or heavy equipment when tired or sleepy. Review and reiteration of good sleep hygiene measures should be pursued with any patient. Other causes of the patient's symptoms, including circadian rhythm disturbances, an underlying mood disorder, medication effect and/or an underlying medical problem cannot be ruled out based on this test. Clinical correlation is recommended.  The patient and his referring provider will be notified of the test results. The patient will be seen in follow up in sleep clinic at [facility], as necessary.  I certify that I have reviewed the raw data recording prior to the issuance of this report in accordance with the standards of the American Academy of Sleep Medicine (AASM).  INTERPRETING PHYSICIAN:  Huston Foley, MD, PhD Medical Director, [facility]) Diplomat, ABPN (Neurology and Sleep)   [facility] [address], [address] [place], [place] [address] [phone]

## 2022-10-22 ENCOUNTER — Telehealth: Payer: Self-pay

## 2022-10-22 NOTE — Telephone Encounter (Signed)
-----   Message from Huston Foley sent at 10/19/2022  1:50 PM EDT ----- Patient referred by PCP, seen by me on 09/20/2022, patient had HST on 10/09/2022.    Please advise patient that his home sleep test was limited and suboptimal secondary to total sleep time of less than 3 hours.  Based on the current test results he has mild sleep apnea but the results may be underestimated or even overestimated.  Mild sleep apnea can be treated with an AutoPap machine, if he would like to start treatment at home with an AutoPap machine, I would be happy to prescribe a machine.  Alternative treatment options may include weight loss and avoiding sleeping on the back or an oral appliance through dentistry or orthodontics.  If he would rather have a referral to a dentist we can facilitate with a referral.   Let me know how he would like to proceed.    Of note, he was offered a repeat home sleep test by [facility] already, and also the possibility of pursuing an in lab sleep study but he declined these options; therefore, we are left with only a suboptimal test.   Thanks,   Huston Foley, MD, PhD [facility] ([facility])

## 2022-10-22 NOTE — Telephone Encounter (Signed)
Contacted pt, informed him that his home sleep test was limited and suboptimal secondary to total sleep time of less than 3 hours.  Based on the current test results he has mild sleep apnea but the results may be underestimated or even overestimated.  Mild sleep apnea can be treated with an AutoPap machine, if he would like to start treatment at home with an AutoPap machine, Dr Frances Furbish would be happy to prescribe a machine.  Alternative treatment options may include weight loss and avoiding sleeping on the back or an oral appliance through dentistry or orthodontics.  Patient stated for now, he will try to lose weight and avoid sleeping on his back.  I advised him if he feels that therapy is needed in the future, he can reach out to [place] and we will get him started on PAP therapy.  Patient verbally understood the results and was appreciative for the call.

## 2022-11-05 DIAGNOSIS — M791 Myalgia, unspecified site: Secondary | ICD-10-CM | POA: Diagnosis not present

## 2022-11-05 DIAGNOSIS — M549 Dorsalgia, unspecified: Secondary | ICD-10-CM | POA: Diagnosis not present

## 2022-11-05 DIAGNOSIS — R5383 Other fatigue: Secondary | ICD-10-CM | POA: Diagnosis not present

## 2022-11-05 DIAGNOSIS — R509 Fever, unspecified: Secondary | ICD-10-CM | POA: Diagnosis not present

## 2022-11-05 DIAGNOSIS — R5381 Other malaise: Secondary | ICD-10-CM | POA: Diagnosis not present

## 2022-11-14 DIAGNOSIS — Z630 Problems in relationship with spouse or partner: Secondary | ICD-10-CM | POA: Diagnosis not present

## 2022-11-14 DIAGNOSIS — F4320 Adjustment disorder, unspecified: Secondary | ICD-10-CM | POA: Diagnosis not present

## 2022-11-29 DIAGNOSIS — F4320 Adjustment disorder, unspecified: Secondary | ICD-10-CM | POA: Diagnosis not present

## 2022-11-29 DIAGNOSIS — Z630 Problems in relationship with spouse or partner: Secondary | ICD-10-CM | POA: Diagnosis not present

## 2022-12-13 DIAGNOSIS — F4320 Adjustment disorder, unspecified: Secondary | ICD-10-CM | POA: Diagnosis not present

## 2022-12-13 DIAGNOSIS — Z630 Problems in relationship with spouse or partner: Secondary | ICD-10-CM | POA: Diagnosis not present

## 2022-12-27 DIAGNOSIS — Z630 Problems in relationship with spouse or partner: Secondary | ICD-10-CM | POA: Diagnosis not present

## 2022-12-27 DIAGNOSIS — F4320 Adjustment disorder, unspecified: Secondary | ICD-10-CM | POA: Diagnosis not present

## 2023-01-21 NOTE — Progress Notes (Unsigned)
[facility] [address] [place], [place] [address] [phone]  Date:  01/23/2023   Name:  Carl Rivera   DOB:  [birth year]   MRN:  [id number]  PCP:  Pearline Cables, MD    Chief Complaint: skin issue (Boil on the back of the L hamstring x Sunday 01/20/23. It does have a head on it. )   History of Present Illness:  Carl Rivera is a 42 y.o. very pleasant male patient who presents with the following:  Pt seen today with concern of a boil on his leg posterior thigh.  It has been present for about 3 days, seems to be getting larger.  He is not sure what could have caused this, he is not aware of any insect or spider bite.  He notes that Monday (today is Wednesday) he was not feeling that great, he felt quite tired-no fever however.  Today he is feeling better but the area in his leg is still painful  Last seen by myself in July  .  Patient Active Problem List   Diagnosis Date Noted   Prediabetes 11/27/2019   Right lower quadrant abdominal pain 09/18/2018   Overweight 12/24/2016   Benign neoplasm of colon 08/05/2013   Family history of malignant neoplasm of gastrointestinal tract 08/05/2013    Past Medical History:  Diagnosis Date   Family history of colon cancer    Localized superficial swelling, mass, or lump    pt unsure what this is.    Past Surgical History:  Procedure Laterality Date   COLONOSCOPY     LAPAROSCOPIC APPENDECTOMY N/A 09/18/2018   Procedure: APPENDECTOMY LAPAROSCOPIC;  Surgeon: Ovidio Kin, MD;  Location: [facility];  Service: General;  Laterality: N/A;   LIPOMA EXCISION     x2/ on side abdomen   TOE SURGERY  2011   Bil pinkey toe surgery/ removed the bones   VASECTOMY      Social History   Tobacco Use   Smoking status: Never   Smokeless tobacco: Never  Vaping Use   Vaping status: Never Used  Substance Use Topics   Alcohol use: Yes    Comment: occasional   Drug use: Never     Family History  Problem Relation Age of Onset   Familial polyposis Father        multiple polyps (50-60) ; unknown pathology; no cancer   Insomnia Father    Colon polyps Brother    Cancer Paternal Uncle 37       colorectal cancer   Familial polyposis Paternal Uncle 58       polyp history; unknown type or number   ALS Maternal Grandfather    Cancer Paternal Grandmother 66       colon cancer; breast cancer in 64s   Sleep apnea Paternal Grandfather    Cancer Other 70       1st cousin once removed (pat grandmother's sister's son) with colorectal cancer    No Known Allergies  Medication list has been reviewed and updated.  No current outpatient medications on file prior to visit.   No current facility-administered medications on file prior to visit.    Review of Systems:  As per HPI- otherwise negative.   Physical Examination: Vitals:   01/23/23 1325  BP: (!) 142/80  Pulse: (!) 53  Resp: 18  Temp: (!) 97.5 F (36.4 C)  SpO2: 95%   Vitals:   01/23/23  1325  Weight: 252 lb 9.6 oz (114.6 kg)  Height: 6\' 3"  (1.905 m)   Body mass index is 31.57 kg/m. Ideal Body Weight: Weight in (lb) to have BMI = 25: 199.6 GEN: No acute distress; alert,appropriate. PULM: Breathing comfortably in no respiratory distress PSYCH: Normally interactive.    There is a boil on the posterior left thigh midway between knee and glutes. Erythematous areas approximately 3 inches in diameter with a small fluctuant area in the middle. Verbal consent obtained Skin prepped with Betadine, anesthesia with approximately 1.5 mL of 1% lidocaine.  Used an 11 blade to incise over the fluctuant area and expressed pus.  Applied pressure to wound until hemostatic, then applied dressing and a light pressure wrap around the thigh to prevent bleeding  Patient tolerated procedure well with no immediate complications, estimated blood loss 5 mL Assessment and Plan: Abscess of left leg - Plan: doxycycline  (VIBRAMYCIN) 100 MG capsule  Patient seen today with an abscess of the posterior left leg.  I&D with drainage of pus today.  Applied dressing, went over instructions and verbal and written form with patient.  Start doxycycline for 10 days  It was good to see you again today, I hope that your leg is feeling much better. Doxycycline twice daily for 10 days, take with a meal and a glass of water or other beverage  You can take the pressure wrap off your leg in an hour or 2, sooner if it seems to be too tight.  If any bleeding comes back apply pressure or an Ace bandage or other dressing to put pressure around the leg.  If this fails to get the bleeding under control please contact me or otherwise seek care.  Please let me know if the wound does not seem to be improving or if you have any other symptoms such as fever or flulike symptoms.  Keep your wound clean and dry until tomorrow.  Tomorrow you can shower as usual, pat dry and apply clean dressing as needed for the next few days  Signed Abbe Amsterdam, MD

## 2023-01-23 ENCOUNTER — Ambulatory Visit: Payer: Federal, State, Local not specified - PPO | Admitting: Family Medicine

## 2023-01-23 VITALS — BP 142/80 | HR 53 | Temp 97.5°F | Resp 18 | Ht 75.0 in | Wt 252.6 lb

## 2023-01-23 DIAGNOSIS — L02416 Cutaneous abscess of left lower limb: Secondary | ICD-10-CM

## 2023-01-23 MED ORDER — DOXYCYCLINE HYCLATE 100 MG PO CAPS: 100.0000 mg | ORAL_CAPSULE | Freq: Two times a day (BID) | ORAL | 0 refills | Status: DC

## 2023-01-23 NOTE — Patient Instructions (Signed)
It was good to see you again today, I hope that your leg is feeling much better. Doxycycline twice daily for 10 days, take with a meal and a glass of water or other beverage  You can take the pressure wrap off your leg in an hour or 2, sooner if it seems to be too tight.  If any bleeding comes back apply pressure or an Ace bandage or other dressing to put pressure around the leg.  If this fails to get the bleeding under control please contact me or otherwise seek care.  Please let me know if the wound does not seem to be improving or if you have any other symptoms such as fever or flulike symptoms.  Keep your wound clean and dry until tomorrow.  Tomorrow you can shower as usual, pat dry and apply clean dressing as needed for the next few days

## 2023-03-12 ENCOUNTER — Encounter: Payer: Self-pay | Admitting: Family Medicine

## 2023-04-06 ENCOUNTER — Encounter: Payer: Self-pay | Admitting: Family Medicine

## 2023-04-07 NOTE — Progress Notes (Addendum)
 [facility] at [facility] [address] [place], [place] [address] [phone]  Date:  04/11/2023   Name:  Carl Rivera   DOB:  05-17-1980   MRN:  [id number]  PCP:  Watt Harlene BROCKS, MD    Chief Complaint: Stress (Pt has been under an extreme amount of stress since 03/24/23. His BP has been very elevated, he has been able to hear his pulse in his head/ear, and has started having some vision changes since 04/09/23. )   History of Present Illness:  Carl Rivera is a 43 y.o. 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  Married, 4 young children  He notes when he lays down at night he may feel the blood rushing in my ears - he has noted this nightly for the last 2 weeks He only notes it when he is laying down quietly at night-it will persist for a little while in the morning but by the time he finishes his shower it typically has resolved Never had this before  Last week he noted a very runny nose and felt like he had a sinus infection This seems to be clearing up on it's own  He has not been checking his BP at home but he may feel like it is running high some of the time  No CP or SOB   He is sleeping 6- 7 hours a night- this is pretty typical for him He notes this pulsatile tinnitus seem to start after a particularly exhausting several days away on a work trip  He also notes that 2 days ago he  had a possible visual aura, he noted that his vision in both eyes appeared somewhat wavy, such as when there is heat rising off of a hot highway.  This resolved on its own and is no longer present.  No flashing lights, no floaters, no shade being pulled down He is watch for any other neurologic symptoms such as slurred speech, limb weakness or numbness, difficulty swallowing or facial drooping, none of these have occurred  BP Readings from Last 3 Encounters:  04/11/23 132/70  01/23/23 (!) 142/80  09/20/22 119/71     BP Readings from Last 3 Encounters:  01/23/23 (!) 142/80  09/20/22 119/71  08/20/22 120/78     Patient Active Problem List   Diagnosis Date Noted   Prediabetes 11/27/2019   Right lower quadrant abdominal pain 09/18/2018   Overweight 12/24/2016   Benign neoplasm of colon 08/05/2013   Family history of malignant neoplasm of gastrointestinal tract 08/05/2013    Past Medical History:  Diagnosis Date   Family history of colon cancer    Localized superficial swelling, mass, or lump    pt unsure what this is.    Past Surgical History:  Procedure Laterality Date   COLONOSCOPY     LAPAROSCOPIC APPENDECTOMY N/A 09/18/2018   Procedure:  APPENDECTOMY LAPAROSCOPIC;  Surgeon: Ethyl Lenis, MD;  Location: [facility];  Service: General;  Laterality: N/A;   LIPOMA EXCISION     x2/ on side abdomen   TOE SURGERY  2011   Bil pinkey toe surgery/ removed the bones   VASECTOMY      Social History   Tobacco Use   Smoking status: Never   Smokeless tobacco: Never  Vaping Use   Vaping status: Never Used  Substance Use Topics   Alcohol use: Yes    Comment: occasional   Drug use: Never    Family History  Problem Relation Age of Onset   Familial polyposis Father        multiple polyps (50-60) ; unknown pathology; no cancer   Insomnia Father    Colon polyps Brother    Cancer Paternal Uncle 42       colorectal cancer   Familial polyposis Paternal Uncle 35       polyp  history; unknown type or number   ALS Maternal Grandfather    Cancer Paternal Grandmother 43       colon cancer; breast cancer in 81s   Sleep apnea Paternal Grandfather    Cancer Other 83       1st cousin once removed (pat grandmother's sister's son) with colorectal cancer    No Known Allergies  Medication list has been reviewed and updated.  No current outpatient medications on file prior to visit.   No current facility-administered medications on file prior to visit.    Review of Systems:  As per HPI- otherwise negative.   Physical Examination: Vitals:   04/11/23 1516  BP: 132/70  Pulse: 75  Resp: 18  Temp: 97.9 F (36.6 C)  SpO2: 97%   Vitals:   04/11/23 1516  Weight: 246 lb (111.6 kg)   Body mass index is 30.75 kg/m. Ideal Body Weight:    GEN: no acute distress.  Tall build, overweight.  Looks well HEENT: Atraumatic, Normocephalic.  Bilateral TM wnl, oropharynx normal.  PEERL,EOMI. no carotid bruit Ears and Nose: No external deformity. CV: RRR, No M/G/R. No JVD. No thrill. No extra heart sounds. PULM: CTA B, no wheezes, crackles, rhonchi. No retractions. No resp. distress. No accessory muscle use. ABD: S, NT, ND, +BS. No rebound. No HSM. EXTR: No c/c/e PSYCH: Normally interactive. Conversant.    Assessment and Plan: Pulsatile tinnitus - Plan: US  Carotid Duplex Bilateral, TSH, Sedimentation rate, MR Angiogram Head Wo Contrast, MR BRAIN/IAC W WO CONTRAST  Screening for hyperlipidemia - Plan: Lipid panel  Pre-diabetes - Plan: Comprehensive metabolic panel, Hemoglobin A1c  Screening for deficiency anemia - Plan: CBC  Patient seen today with concern of pulsatile tinnitus and possibly elevated blood pressure.  However, on exam today his blood pressure looks okay.  We will continue to monitor this.  Will obtain some basic blood work as above.  Will workup pulsatile tinnitus with carotid Dopplers and MRI angiogram of head, MRI internal auditory canal I have  asked him to please let me know if anything is changing or getting worse, or if any visual symptoms return in the meantime Signed Harlene Schroeder, MD  Addendum 2/7, received labs as below.  Message to patient  Results for orders placed or performed in visit on 04/11/23  CBC   Collection Time: 04/11/23  3:55 PM  Result Value Ref Range   WBC 5.8 4.0 - 10.5 K/uL   RBC 5.01 4.22 - 5.81 Mil/uL   Platelets 171.0 150.0 - 400.0  K/uL   Hemoglobin 15.0 13.0 - 17.0 g/dL   HCT 56.6 60.9 - 47.9 %   MCV 86.5 78.0 - 100.0 fl   MCHC 34.5 30.0 - 36.0 g/dL   RDW 86.4 88.4 - 84.4 %  Comprehensive metabolic panel   Collection Time: 04/11/23  3:55 PM  Result Value Ref Range   Sodium 144 135 - 145 mEq/L   Potassium 3.9 3.5 - 5.1 mEq/L   Chloride 105 96 - 112 mEq/L   CO2 26 19 - 32 mEq/L   Glucose, Bld 115 (H) 70 - 99 mg/dL   BUN 14 6 - 23 mg/dL   Creatinine, Ser 8.80 0.40 - 1.50 mg/dL   Total Bilirubin 0.4 0.2 - 1.2 mg/dL   Alkaline Phosphatase 70 39 - 117 U/L   AST 18 0 - 37 U/L   ALT 33 0 - 53 U/L   Total Protein 6.9 6.0 - 8.3 g/dL   Albumin 4.4 3.5 - 5.2 g/dL   GFR 24.07 >39.99 mL/min   Calcium 9.1 8.4 - 10.5 mg/dL  Hemoglobin J8r   Collection Time: 04/11/23  3:55 PM  Result Value Ref Range   Hgb A1c MFr Bld 6.1 4.6 - 6.5 %  Lipid panel   Collection Time: 04/11/23  3:55 PM  Result Value Ref Range   Cholesterol 179 0 - 200 mg/dL   Triglycerides 872.9 0.0 - 149.0 mg/dL   HDL 52.29 >60.99 mg/dL   VLDL 74.5 0.0 - 59.9 mg/dL   LDL Cholesterol 893 (H) 0 - 99 mg/dL   Total CHOL/HDL Ratio 4    NonHDL 131.79   TSH   Collection Time: 04/11/23  3:55 PM  Result Value Ref Range   TSH 0.82 0.35 - 5.50 uIU/mL  Sedimentation rate   Collection Time: 04/11/23  3:55 PM  Result Value Ref Range   Sed Rate 6 0 - 15 mm/hr

## 2023-04-10 ENCOUNTER — Ambulatory Visit: Payer: Federal, State, Local not specified - PPO | Admitting: Family Medicine

## 2023-04-11 ENCOUNTER — Ambulatory Visit: Payer: Federal, State, Local not specified - PPO | Admitting: Family Medicine

## 2023-04-11 VITALS — BP 132/70 | HR 75 | Temp 97.9°F | Resp 18 | Ht 75.0 in | Wt 246.0 lb

## 2023-04-11 DIAGNOSIS — Z13220 Encounter for screening for lipoid disorders: Secondary | ICD-10-CM | POA: Diagnosis not present

## 2023-04-11 DIAGNOSIS — H93A9 Pulsatile tinnitus, unspecified ear: Secondary | ICD-10-CM | POA: Diagnosis not present

## 2023-04-11 DIAGNOSIS — Z130 Encounter for screening for diseases of the blood and blood-forming organs and certain disorders involving the immune mechanism: Secondary | ICD-10-CM | POA: Diagnosis not present

## 2023-04-11 DIAGNOSIS — R7303 Prediabetes: Secondary | ICD-10-CM

## 2023-04-11 NOTE — Patient Instructions (Signed)
 It was good to see you today- I will be in touch with your labs and we will set up your MRI asap Please stop by lab and then imaging- they will set up your carotid artery ultrasound

## 2023-04-12 ENCOUNTER — Encounter: Payer: Self-pay | Admitting: Family Medicine

## 2023-04-12 ENCOUNTER — Ambulatory Visit (HOSPITAL_BASED_OUTPATIENT_CLINIC_OR_DEPARTMENT_OTHER)
Admission: RE | Admit: 2023-04-12 | Discharge: 2023-04-12 | Disposition: A | Discharge status: Home / Self Care | Payer: Federal, State, Local not specified - PPO | Source: Ambulatory Visit | Attending: Family Medicine | Admitting: Family Medicine

## 2023-04-12 DIAGNOSIS — H93A9 Pulsatile tinnitus, unspecified ear: Secondary | ICD-10-CM | POA: Insufficient documentation

## 2023-04-12 LAB — COMPREHENSIVE METABOLIC PANEL
ALT: 33 U/L (ref 0–53)
AST: 18 U/L (ref 0–37)
Albumin: 4.4 g/dL (ref 3.5–5.2)
Alkaline Phosphatase: 70 U/L (ref 39–117)
BUN: 14 mg/dL (ref 6–23)
CO2: 26 meq/L (ref 19–32)
Calcium: 9.1 mg/dL (ref 8.4–10.5)
Chloride: 105 meq/L (ref 96–112)
Creatinine, Ser: 1.19 mg/dL (ref 0.40–1.50)
GFR: 75.92 mL/min (ref >60.00)
Glucose, Bld: 115 mg/dL — ABNORMAL HIGH (ref 70–99)
Potassium: 3.9 meq/L (ref 3.5–5.1)
Sodium: 144 meq/L (ref 135–145)
Total Bilirubin: 0.4 mg/dL (ref 0.2–1.2)
Total Protein: 6.9 g/dL (ref 6.0–8.3)

## 2023-04-12 LAB — LIPID PANEL
Cholesterol: 179 mg/dL (ref 0–200)
HDL: 47.7 mg/dL (ref >39.00)
LDL Cholesterol: 106 mg/dL — ABNORMAL HIGH (ref 0–99)
NonHDL: 131.79
Total CHOL/HDL Ratio: 4
Triglycerides: 127 mg/dL (ref 0.0–149.0)
VLDL: 25.4 mg/dL (ref 0.0–40.0)

## 2023-04-12 LAB — CBC
HCT: 43.3 % (ref 39.0–52.0)
Hemoglobin: 15 g/dL (ref 13.0–17.0)
MCHC: 34.5 g/dL (ref 30.0–36.0)
MCV: 86.5 fL (ref 78.0–100.0)
Platelets: 171 10*3/uL (ref 150.0–400.0)
RBC: 5.01 Mil/uL (ref 4.22–5.81)
RDW: 13.5 % (ref 11.5–15.5)
WBC: 5.8 10*3/uL (ref 4.0–10.5)

## 2023-04-12 LAB — HEMOGLOBIN A1C: Hgb A1c MFr Bld: 6.1 % (ref 4.6–6.5)

## 2023-04-12 LAB — SEDIMENTATION RATE: Sed Rate: 6 mm/h (ref 0–15)

## 2023-04-12 LAB — TSH: TSH: 0.82 u[IU]/mL (ref 0.35–5.50)

## 2023-04-13 ENCOUNTER — Encounter: Payer: Self-pay | Admitting: Family Medicine

## 2023-04-13 DIAGNOSIS — E041 Nontoxic single thyroid nodule: Secondary | ICD-10-CM

## 2023-04-17 ENCOUNTER — Ambulatory Visit (HOSPITAL_BASED_OUTPATIENT_CLINIC_OR_DEPARTMENT_OTHER)
Admission: RE | Admit: 2023-04-17 | Discharge: 2023-04-17 | Disposition: A | Discharge status: Home / Self Care | Payer: Federal, State, Local not specified - PPO | Source: Ambulatory Visit | Attending: Family Medicine | Admitting: Family Medicine

## 2023-04-17 DIAGNOSIS — E042 Nontoxic multinodular goiter: Secondary | ICD-10-CM | POA: Diagnosis not present

## 2023-04-17 DIAGNOSIS — E041 Nontoxic single thyroid nodule: Secondary | ICD-10-CM | POA: Diagnosis not present

## 2023-04-17 NOTE — Addendum Note (Signed)
Addended by: Abbe Amsterdam C on: 04/17/2023 12:40 PM   Modules accepted: Orders

## 2023-04-18 ENCOUNTER — Encounter: Payer: Self-pay | Admitting: Family Medicine

## 2023-04-20 ENCOUNTER — Ambulatory Visit (HOSPITAL_COMMUNITY)
Admission: RE | Admit: 2023-04-20 | Discharge: 2023-04-20 | Disposition: A | Discharge status: Home / Self Care | Payer: Federal, State, Local not specified - PPO | Source: Ambulatory Visit | Attending: Family Medicine | Admitting: Family Medicine

## 2023-04-20 DIAGNOSIS — H93A9 Pulsatile tinnitus, unspecified ear: Secondary | ICD-10-CM | POA: Insufficient documentation

## 2023-04-20 MED ORDER — GADOBUTROL 1 MMOL/ML IV SOLN
10.0000 mL | Freq: Once | INTRAVENOUS | Status: AC | PRN
  Administered 2023-04-20: 10 mL via INTRAVENOUS

## 2023-04-22 ENCOUNTER — Encounter: Payer: Self-pay | Admitting: Family Medicine

## 2023-04-22 DIAGNOSIS — S76311A Strain of muscle, fascia and tendon of the posterior muscle group at thigh level, right thigh, initial encounter: Secondary | ICD-10-CM

## 2023-04-22 DIAGNOSIS — H93A9 Pulsatile tinnitus, unspecified ear: Secondary | ICD-10-CM

## 2023-04-28 DIAGNOSIS — R051 Acute cough: Secondary | ICD-10-CM | POA: Diagnosis not present

## 2023-04-28 DIAGNOSIS — J189 Pneumonia, unspecified organism: Secondary | ICD-10-CM | POA: Diagnosis not present

## 2023-04-28 DIAGNOSIS — J019 Acute sinusitis, unspecified: Secondary | ICD-10-CM | POA: Diagnosis not present

## 2023-05-11 NOTE — Addendum Note (Signed)
 Addended by: Abbe Amsterdam C on: 05/11/2023 05:59 AM   Modules accepted: Orders

## 2023-05-12 NOTE — Addendum Note (Signed)
 Addended by: Abbe Amsterdam C on: 05/12/2023 04:09 PM   Modules accepted: Orders

## 2023-05-13 ENCOUNTER — Telehealth: Payer: Self-pay

## 2023-05-13 NOTE — Telephone Encounter (Signed)
 Copied from CRM [id number]. Topic: General - Other >> May 13, 2023 12:18 PM Rodman Pickle T wrote: Reason for CRM: patient is calling in regarding his referral he would like to know if there Is a a update on the referral

## 2023-05-14 NOTE — Telephone Encounter (Signed)
 Are you able to see where the referral stands with Ortho, Dr Edmon Crape?

## 2023-05-15 DIAGNOSIS — S76311A Strain of muscle, fascia and tendon of the posterior muscle group at thigh level, right thigh, initial encounter: Secondary | ICD-10-CM | POA: Diagnosis not present

## 2023-05-15 DIAGNOSIS — S76301A Unspecified injury of muscle, fascia and tendon of the posterior muscle group at thigh level, right thigh, initial encounter: Secondary | ICD-10-CM | POA: Diagnosis not present

## 2023-05-16 ENCOUNTER — Other Ambulatory Visit: Payer: Federal, State, Local not specified - PPO

## 2023-05-16 DIAGNOSIS — S76311A Strain of muscle, fascia and tendon of the posterior muscle group at thigh level, right thigh, initial encounter: Secondary | ICD-10-CM | POA: Diagnosis not present

## 2023-05-16 DIAGNOSIS — Z7409 Other reduced mobility: Secondary | ICD-10-CM | POA: Diagnosis not present

## 2023-05-16 DIAGNOSIS — S76301A Unspecified injury of muscle, fascia and tendon of the posterior muscle group at thigh level, right thigh, initial encounter: Secondary | ICD-10-CM | POA: Diagnosis not present

## 2023-05-16 DIAGNOSIS — R531 Weakness: Secondary | ICD-10-CM | POA: Diagnosis not present

## 2023-05-19 DIAGNOSIS — Z7409 Other reduced mobility: Secondary | ICD-10-CM | POA: Insufficient documentation

## 2023-05-19 DIAGNOSIS — S76311A Strain of muscle, fascia and tendon of the posterior muscle group at thigh level, right thigh, initial encounter: Secondary | ICD-10-CM | POA: Diagnosis not present

## 2023-05-19 DIAGNOSIS — M79651 Pain in right thigh: Secondary | ICD-10-CM | POA: Diagnosis not present

## 2023-05-19 DIAGNOSIS — R600 Localized edema: Secondary | ICD-10-CM | POA: Diagnosis not present

## 2023-05-28 DIAGNOSIS — Z7409 Other reduced mobility: Secondary | ICD-10-CM | POA: Diagnosis not present

## 2023-05-28 DIAGNOSIS — R531 Weakness: Secondary | ICD-10-CM | POA: Diagnosis not present

## 2023-05-28 DIAGNOSIS — S76301A Unspecified injury of muscle, fascia and tendon of the posterior muscle group at thigh level, right thigh, initial encounter: Secondary | ICD-10-CM | POA: Diagnosis not present

## 2023-05-28 DIAGNOSIS — S76311A Strain of muscle, fascia and tendon of the posterior muscle group at thigh level, right thigh, initial encounter: Secondary | ICD-10-CM | POA: Diagnosis not present

## 2023-06-13 ENCOUNTER — Ambulatory Visit: Admitting: Family Medicine

## 2023-06-13 ENCOUNTER — Encounter: Payer: Self-pay | Admitting: Family Medicine

## 2023-06-13 VITALS — BP 132/74 | HR 72 | Temp 98.5°F | Resp 18 | Ht 75.0 in | Wt 249.2 lb

## 2023-06-13 DIAGNOSIS — J029 Acute pharyngitis, unspecified: Secondary | ICD-10-CM

## 2023-06-13 DIAGNOSIS — J40 Bronchitis, not specified as acute or chronic: Secondary | ICD-10-CM | POA: Diagnosis not present

## 2023-06-13 DIAGNOSIS — J020 Streptococcal pharyngitis: Secondary | ICD-10-CM | POA: Insufficient documentation

## 2023-06-13 DIAGNOSIS — J0140 Acute pansinusitis, unspecified: Secondary | ICD-10-CM

## 2023-06-13 LAB — POC COVID19 BINAXNOW: SARS Coronavirus 2 Ag: NEGATIVE

## 2023-06-13 LAB — POCT RAPID STREP A (OFFICE): Rapid Strep A Screen: POSITIVE — AB

## 2023-06-13 MED ORDER — FLUTICASONE PROPIONATE 50 MCG/ACT NA SUSP: 2.0000 | Freq: Every day | NASAL | 6 refills | Status: DC

## 2023-06-13 MED ORDER — PROMETHAZINE-DM 6.25-15 MG/5ML PO SYRP: 5.0000 mL | ORAL_SOLUTION | Freq: Four times a day (QID) | ORAL | 0 refills | Status: DC | PRN

## 2023-06-13 MED ORDER — AMOXICILLIN-POT CLAVULANATE 875-125 MG PO TABS: 1.0000 | ORAL_TABLET | Freq: Two times a day (BID) | ORAL | 0 refills | Status: DC

## 2023-06-13 NOTE — Progress Notes (Signed)
 Established Patient Office Visit  Subjective   Patient ID: Carl Rivera, male    DOB: [birth year]  Age: 43 y.o. MRN: [id number]  Chief Complaint  Patient presents with   Nasal Congestion    Sxs started Friday. Pt states having congestion, fatigue, loss of voice, cough, lack of sleep.     HPI Discussed the use of AI scribe software for clinical note transcription with the patient, who gave verbal consent to proceed.  History of Present Illness Carl Rivera is a 43 year old male who presents with worsening respiratory symptoms and fatigue.  He has been experiencing worsening respiratory symptoms since last Friday, with significant congestion and a persistent cough producing thick, green sputum. He describes difficulty taking a full breath and wheezing, which began two days ago. Attempts to take a full breath result in coughing fits. He has a history of pneumonia twice this year and had an x-ray two to three months ago, which indicated anemia.  He reports a significant sore throat and night sweats, although he has not measured his temperature. He feels he is running hot and has low energy levels. Despite these symptoms, he denies significant sinus pressure but feels fullness in his sinuses.  He experiences significant fatigue, especially at night, and describes his cough as 'honky' and disruptive to his wife's sleep. He has been coughing more at night despite DayQuil reducing his cough during the day.  He has been using over-the-counter medications including DayQuil and cough drops, but has not taken Mucinex or Delsym yet. He is generally 'anti medicine' and only takes medication when necessary.  He has been exposed to pollen, which he feels exacerbated his symptoms, although he does not typically have allergies. He tried taking Xyzal for three days without relief. He has four children, some of whom have allergies, and he suspects he may have contracted his  illness from them.  History of Present Illness Carl Rivera is a 43 year old male who presents with worsening respiratory symptoms and fatigue.  He has been experiencing worsening respiratory symptoms since last Friday, with significant congestion and a persistent cough producing thick, green sputum. He describes difficulty taking a full breath and wheezing, which began two days ago. Attempts to take a full breath result in coughing fits. He has a history of pneumonia twice this year and had an x-ray two to three months ago, which indicated anemia.  He reports a significant sore throat and night sweats, although he has not measured his temperature. He feels he is running hot and has low energy levels. Despite these symptoms, he denies significant sinus pressure but feels fullness in his sinuses.  He experiences significant fatigue, especially at night, and describes his cough as 'honky' and disruptive to his wife's sleep. He has been coughing more at night despite DayQuil reducing his cough during the day.  He has been using over-the-counter medications including DayQuil and cough drops, but has not taken Mucinex or Delsym yet. He is generally 'anti medicine' and only takes medication when necessary.  He has been exposed to pollen, which he feels exacerbated his symptoms, although he does not typically have allergies. He tried taking Xyzal for three days without relief. He has four children, some of whom have allergies, and he suspects he may have contracted his illness from them.   Patient Active Problem List   Diagnosis Date Noted  Bronchitis 06/13/2023   Sore throat 06/13/2023   Acute non-recurrent pansinusitis 06/13/2023   Strep throat 06/13/2023   Prediabetes 11/27/2019   Right lower quadrant abdominal pain 09/18/2018   Overweight 12/24/2016   Benign neoplasm of colon 08/05/2013   Family history of malignant neoplasm of gastrointestinal tract 08/05/2013   Past Medical History:   Diagnosis Date   Family history of colon cancer    Localized superficial swelling, mass, or lump    pt unsure what this is.   Past Surgical History:  Procedure Laterality Date   COLONOSCOPY     LAPAROSCOPIC APPENDECTOMY N/A 09/18/2018   Procedure: APPENDECTOMY LAPAROSCOPIC;  Surgeon: Ovidio Kin, MD;  Location: [facility];  Service: General;  Laterality: N/A;   LIPOMA EXCISION     x2/ on side abdomen   TOE SURGERY  2011   Bil pinkey toe surgery/ removed the bones   VASECTOMY     Social History   Tobacco Use   Smoking status: Never   Smokeless tobacco: Never  Vaping Use   Vaping status: Never Used  Substance Use Topics   Alcohol use: Yes    Comment: occasional   Drug use: Never   Social History   Socioeconomic History   Marital status: Married    Spouse name: Not on file   Number of children: Not on file   Years of education: Not on file   Highest education level: Professional school degree (e.g., MD, DDS, DVM, JD)  Occupational History   Not on file  Tobacco Use   Smoking status: Never   Smokeless tobacco: Never  Vaping Use   Vaping status: Never Used  Substance and Sexual Activity   Alcohol use: Yes    Comment: occasional   Drug use: Never   Sexual activity: Not on file  Other Topics Concern   Not on file  Social History Narrative   Not on file   Social Drivers of Health   Financial Resource Strain: Low Risk  (04/10/2023)   Overall Financial Resource Strain (CARDIA)    Difficulty of Paying Living Expenses: Not hard at all  Food Insecurity: Low Risk  (05/15/2023)   Received from [facility]   Hunger Vital Sign    Worried About Running Out of Food in the Last Year: Never true    Ran Out of Food in the Last Year: Never true  Transportation Needs: No Transportation Needs (05/15/2023)   Received from [facility]    In the past 12 months, has lack of reliable transportation kept you from medical appointments, meetings, work or from getting  things needed for daily living? : No  Physical Activity: Sufficiently Active (04/10/2023)   Exercise Vital Sign    Days of Exercise per Week: 7 days    Minutes of Exercise per Session: 40 min  Stress: Stress Concern Present (04/10/2023)   [employer] of Occupational Health - Occupational Stress Questionnaire    Feeling of Stress : Very much  Social Connections: Socially Integrated (04/10/2023)   Social Connection and Isolation Panel [NHANES]    Frequency of Communication with Friends and Family: More than three times a week    Frequency of Social Gatherings with Friends and Family: More than three times a week    Attends Religious Services: 1 to 4 times per year    Active Member of [employer] or Organizations: Yes    Attends Banker Meetings: More than 4 times per year    Marital Status:  Married  Intimate Partner Violence: Unknown (06/17/2022)   [facility] from [facility]   HITS    Physically Hurt: Not on file    Insult or Talk Down To: Not on file    Threaten Physical Harm: Not on file    Scream or Curse: Not on file   Family Status  Relation Name Status   Mother  Alive   Father  Alive   Brother  Alive   Pat Uncle  Alive   Pat Uncle  Alive   MGF  (Not Specified)   PGM  Alive   PGF  (Not Specified)   Other  (Not Specified)  No partnership data on file   Family History  Problem Relation Age of Onset   Familial polyposis Father        multiple polyps (50-60) ; unknown pathology; no cancer   Insomnia Father    Colon polyps Brother    Cancer Paternal Uncle 30       colorectal cancer   Familial polyposis Paternal Uncle 88       polyp history; unknown type or number   ALS Maternal Grandfather    Cancer Paternal Grandmother 38       colon cancer; breast cancer in 81s   Sleep apnea Paternal Grandfather    Cancer Other 43       1st cousin once removed (pat grandmother's sister's son) with colorectal cancer   No Known Allergies    Review of  Systems  Constitutional:  Negative for chills, fever and malaise/fatigue.  HENT:  Positive for congestion, ear pain and sore throat. Negative for hearing loss and sinus pain.   Eyes:  Negative for blurred vision and discharge.  Respiratory:  Negative for cough, sputum production and shortness of breath.   Cardiovascular:  Negative for chest pain, palpitations and leg swelling.  Gastrointestinal:  Negative for abdominal pain, blood in stool, constipation, diarrhea, heartburn, nausea and vomiting.  Genitourinary:  Negative for dysuria, frequency, hematuria and urgency.  Musculoskeletal:  Negative for back pain, falls and myalgias.  Skin:  Negative for rash.  Neurological:  Negative for dizziness, sensory change, loss of consciousness, weakness and headaches.  Endo/Heme/Allergies:  Negative for environmental allergies. Does not bruise/bleed easily.  Psychiatric/Behavioral:  Negative for depression and suicidal ideas. The patient is not nervous/anxious and does not have insomnia.       Objective:     BP 132/74 (BP Location: Right Arm, Patient Position: Sitting, Cuff Size: Large)   Pulse 72   Temp 98.5 F (36.9 C) (Oral)   Resp 18   Ht 6\' 3"  (1.905 m)   Wt 249 lb 3.2 oz (113 kg)   SpO2 98%   BMI 31.15 kg/m  BP Readings from Last 3 Encounters:  06/13/23 132/74  04/11/23 132/70  01/23/23 (!) 142/80   Wt Readings from Last 3 Encounters:  06/13/23 249 lb 3.2 oz (113 kg)  04/11/23 246 lb (111.6 kg)  01/23/23 252 lb 9.6 oz (114.6 kg)   SpO2 Readings from Last 3 Encounters:  06/13/23 98%  04/11/23 97%  01/23/23 95%      Physical Exam Vitals and nursing note reviewed.  Constitutional:      General: He is not in acute distress.    Appearance: Normal appearance. He is well-developed.  HENT:     Head: Normocephalic and atraumatic.     Nose: Congestion present.     Mouth/Throat:     Pharynx: Posterior oropharyngeal erythema present.  Eyes:     General: No scleral icterus.        Right eye: No discharge.        Left eye: No discharge.  Cardiovascular:     Rate and Rhythm: Normal rate and regular rhythm.     Heart sounds: No murmur heard. Pulmonary:     Effort: Pulmonary effort is normal. No respiratory distress.     Breath sounds: Normal breath sounds.  Musculoskeletal:        General: Normal range of motion.     Cervical back: Normal range of motion and neck supple.     Right lower leg: No edema.     Left lower leg: No edema.  Skin:    General: Skin is warm and dry.  Neurological:     Mental Status: He is alert and oriented to person, place, and time.  Psychiatric:        Mood and Affect: Mood normal.        Behavior: Behavior normal.        Thought Content: Thought content normal.        Judgment: Judgment normal.      Results for orders placed or performed in visit on 06/13/23  POC COVID-19  Result Value Ref Range   SARS Coronavirus 2 Ag Negative Negative  POCT rapid strep A  Result Value Ref Range   Rapid Strep A Screen Positive (A) Negative    Last CBC Lab Results  Component Value Date   WBC 5.8 04/11/2023   HGB 15.0 04/11/2023   HCT 43.3 04/11/2023   MCV 86.5 04/11/2023   MCH 29.8 11/26/2019   RDW 13.5 04/11/2023   PLT 171.0 04/11/2023   Last metabolic panel Lab Results  Component Value Date   GLUCOSE 115 (H) 04/11/2023   NA 144 04/11/2023   K 3.9 04/11/2023   CL 105 04/11/2023   CO2 26 04/11/2023   BUN 14 04/11/2023   CREATININE 1.19 04/11/2023   GFR 75.92 04/11/2023   CALCIUM 9.1 04/11/2023   PROT 6.9 04/11/2023   ALBUMIN 4.4 04/11/2023   BILITOT 0.4 04/11/2023   ALKPHOS 70 04/11/2023   AST 18 04/11/2023   ALT 33 04/11/2023   ANIONGAP 6 04/24/2018   Last lipids Lab Results  Component Value Date   CHOL 179 04/11/2023   HDL 47.70 04/11/2023   LDLCALC 106 (H) 04/11/2023   LDLDIRECT 115.0 08/16/2022   TRIG 127.0 04/11/2023   CHOLHDL 4 04/11/2023   Last hemoglobin A1c Lab Results  Component Value Date    HGBA1C 6.1 04/11/2023   Last thyroid functions Lab Results  Component Value Date   TSH 0.82 04/11/2023   Last vitamin D Lab Results  Component Value Date   VD25OH 30 11/26/2019   Last vitamin B12 and Folate No results found for: "VITAMINB12", "FOLATE"    The 10-year ASCVD risk score (Arnett DK, et al., 2019) is: 1.1%    Assessment & Plan:   Problem List Items Addressed This Visit       Unprioritized   Strep throat   AUGMENTIN SENT IN RETURN TO OFFICE AS NEEDED       Sore throat   Relevant Orders   POCT rapid strep A (Completed)   Bronchitis   Relevant Medications   fluticasone (FLONASE) 50 MCG/ACT nasal spray   amoxicillin-clavulanate (AUGMENTIN) 875-125 MG tablet   promethazine-dextromethorphan (PROMETHAZINE-DM) 6.25-15 MG/5ML syrup   Other Relevant Orders   DG Chest 2 View   POC COVID-19 (  Completed)   Acute non-recurrent pansinusitis - Primary   Relevant Medications   fluticasone (FLONASE) 50 MCG/ACT nasal spray   amoxicillin-clavulanate (AUGMENTIN) 875-125 MG tablet   promethazine-dextromethorphan (PROMETHAZINE-DM) 6.25-15 MG/5ML syrup   Other Relevant Orders   DG Chest 2 View   POC COVID-19 (Completed)   Assessment and Plan Assessment & Plan Streptococcal pharyngitis   Acute streptococcal pharyngitis is confirmed by a positive strep test. He experiences significant sore throat, congestion, fatigue, and loss of voice, along with thick, green sputum production, wheezing, and difficulty taking a full breath. Recent pollen exposure and previous pneumonia episodes this year were noted. Although bronchitis and pneumonia were considered, strep throat was confirmed. Augmentin is prescribed for its effectiveness against throat and respiratory infections. Potential side effects of prednisone, such as sleep disturbances, were discussed, and its use was avoided. He should avoid work and close contact with others for 24 hours after starting antibiotics. Flonase is  prescribed for nasal congestion, and promethazine DM is prescribed for nighttime cough relief.  General Health Maintenance   He has had pneumonia twice this year and recent weight gain due to a hamstring injury. The potential development of allergies later in life and the impact of pollen on respiratory symptoms were discussed. He is advised to use Flonase as needed for nasal congestion and to monitor for potential allergy development.   No follow-ups on file.    Donato Schultz, DO

## 2023-06-13 NOTE — Assessment & Plan Note (Signed)
 AUGMENTIN SENT IN RETURN TO OFFICE AS NEEDED

## 2023-06-30 DIAGNOSIS — J029 Acute pharyngitis, unspecified: Secondary | ICD-10-CM | POA: Diagnosis not present

## 2023-08-13 ENCOUNTER — Encounter: Payer: Self-pay | Admitting: Family Medicine

## 2023-08-14 NOTE — Telephone Encounter (Signed)
 Pt came in to pick up form but it was not in the front office. Pt asks that the form be emailed instead.

## 2023-08-20 NOTE — Progress Notes (Signed)
 [facility] [address], [address] [place], [place] [address] [phone]  Date:  08/26/2023   Name:  Carl Rivera   DOB:  [birth year]   MRN:  [id number]  PCP:  Watt Harlene BROCKS, MD    Chief Complaint: Annual Exam   History of Present Illness:  Carl Rivera is a 43 y.o. very pleasant male patient who presents with the following:  Pt seen today for CPE- history of prediabetes, overweight, family history of colon cancer and prostate cancer  Last seen by myself in February when he was dealing with pulsatile tinnitus He also saw my partner Dr Antonio Meth in April for strep throat   He still notes the pulsatile sound in his head- notes it is positional For example if he lays down flat he will hear it - his wife can also hear it when he is supine.  We did an MRI/A of his head in February which was basically benign.    Married with 4 children-ages  08/10/08/12  They recently went to a scout camp with his son  He is tired from the trip but otherwise feeling well   He notes difficulty hearing higher pitched voices like children at the scout camp.  Adults are ok, not causing issues generally  He notes urinary frequency at night- he may have to get up 3x a night This has been going on for the last month or so- no change during the day.  He tries to stay really well hydrated and thinks this may be the explanation   He notes 3 members of his family on his father's side had colon cancer One cousin had advanced cancer in his 30s He is already getting regular colonscopy but it looks like he is due  Lab Results  Component Value Date   HGBA1C 6.1 04/11/2023     Patient Active Problem List   Diagnosis Date Noted   Bronchitis 06/13/2023   Sore throat 06/13/2023   Acute non-recurrent pansinusitis 06/13/2023   Strep throat 06/13/2023   Decreased mobility and endurance 05/19/2023   Prediabetes 11/27/2019   Overweight 12/24/2016   Benign  neoplasm of colon 08/05/2013   Family history of malignant neoplasm of gastrointestinal tract 08/05/2013    Past Medical History:  Diagnosis Date   Family history of colon cancer    Localized superficial swelling, mass, or lump    pt unsure what this is.    Past Surgical History:  Procedure Laterality Date   COLONOSCOPY     LAPAROSCOPIC APPENDECTOMY N/A 09/18/2018   Procedure: APPENDECTOMY LAPAROSCOPIC;  Surgeon: Ethyl Lenis, MD;  Location: [facility];  Service: General;  Laterality: N/A;   LIPOMA EXCISION     x2/ on side abdomen   TOE SURGERY  2011   Bil pinkey toe surgery/ removed the bones   VASECTOMY      Social History   Tobacco Use   Smoking status: Never   Smokeless tobacco: Never  Vaping Use   Vaping status: Never Used  Substance Use Topics   Alcohol use: Yes    Comment: occasional   Drug use: Never    Family History  Problem Relation Age of Onset   Familial polyposis Father        multiple polyps (50-60) ; unknown pathology; no cancer   Insomnia Father    Colon polyps Brother    Cancer Paternal Uncle 55  colorectal cancer   Familial polyposis Paternal Uncle 71       polyp history; unknown type or number   ALS Maternal Grandfather    Cancer Paternal Grandmother 11       colon cancer; breast cancer in 28s   Sleep apnea Paternal Grandfather    Cancer Other 57       1st cousin once removed (pat grandmother's sister's son) with colorectal cancer    No Known Allergies  Medication list has been reviewed and updated.  No current outpatient medications on file prior to visit.   No current facility-administered medications on file prior to visit.    Review of Systems:  As per HPI- otherwise negative.   Physical Examination: Vitals:   08/26/23 1358  BP: 136/68  Pulse: 76  SpO2: 97%   Vitals:   08/26/23 1358  Weight: 253 lb (114.8 kg)  Height: 6' 3 (1.905 m)   Body mass index is 31.62 kg/m. Ideal Body Weight: Weight in (lb) to have  BMI = 25: 199.6  GEN: no acute distress.  Mild obesity, tall build,  looks well  HEENT: Atraumatic, Normocephalic.  Bilateral TM wnl, oropharynx normal.  PEERL,EOMI.   Ears and Nose: No external deformity. CV: RRR, No M/G/R. No JVD. No thrill. No extra heart sounds. PULM: CTA B, no wheezes, crackles, rhonchi. No retractions. No resp. distress. No accessory muscle use. ABD: S, NT, ND. No rebound. No HSM. EXTR: No c/c/e PSYCH: Normally interactive. Conversant.  When put is supine I also can head pulsing at the back of his head with stethoscope   Assessment and Plan: Physical exam  Urinary frequency - Plan: PSA, Basic metabolic panel with GFR  Objective pulsatile tinnitus - Plan: Ambulatory referral to Vascular Surgery  Mild hearing loss Physical exam today- encouraged healthy diet and exercise routine Family history of prostate cancer, urinary frequency- he does not want to try meds now.  Encouraged fluid restriction for a couple of hours before bed. Check PSA Will have him see vascular- likely benign pulsating sound in head but would appreciate their opinion Hearing loss- encouraged him to protect his hearing.  When bothersome he can get hearing aids   Signed Harlene Schroeder, MD  Addendum 6/24, received labs as below.  Message to patient  Results for orders placed or performed in visit on 08/26/23  PSA   Collection Time: 08/26/23  2:21 PM  Result Value Ref Range   PSA 0.97 0.10 - 4.00 ng/mL  Basic metabolic panel with GFR   Collection Time: 08/26/23  2:21 PM  Result Value Ref Range   Sodium 141 135 - 145 mEq/L   Potassium 4.2 3.5 - 5.1 mEq/L   Chloride 105 96 - 112 mEq/L   CO2 29 19 - 32 mEq/L   Glucose, Bld 99 70 - 99 mg/dL   BUN 17 6 - 23 mg/dL   Creatinine, Ser 8.84 0.40 - 1.50 mg/dL   GFR 21.10 >39.99 mL/min   Calcium 9.1 8.4 - 10.5 mg/dL

## 2023-08-21 ENCOUNTER — Encounter: Payer: Federal, State, Local not specified - PPO | Admitting: Family Medicine

## 2023-08-26 ENCOUNTER — Encounter: Payer: Self-pay | Admitting: Family Medicine

## 2023-08-26 ENCOUNTER — Ambulatory Visit (INDEPENDENT_AMBULATORY_CARE_PROVIDER_SITE_OTHER): Admitting: Family Medicine

## 2023-08-26 VITALS — BP 136/68 | HR 76 | Ht 75.0 in | Wt 253.0 lb

## 2023-08-26 DIAGNOSIS — H93A9 Pulsatile tinnitus, unspecified ear: Secondary | ICD-10-CM

## 2023-08-26 DIAGNOSIS — R350 Frequency of micturition: Secondary | ICD-10-CM

## 2023-08-26 DIAGNOSIS — Z0000 Encounter for general adult medical examination without abnormal findings: Secondary | ICD-10-CM | POA: Diagnosis not present

## 2023-08-26 DIAGNOSIS — H9190 Unspecified hearing loss, unspecified ear: Secondary | ICD-10-CM

## 2023-08-26 NOTE — Patient Instructions (Addendum)
 It was good to see you today- I will be in touch with your labs asap Please call [facility] to discuss your next colon Phone: [phone]  I will set you up to see vascular surgery to check on the pulsatile sound!    Try decreasing fluid for 1-2 hours before bed and see if that helps you!

## 2023-08-27 ENCOUNTER — Encounter: Payer: Self-pay | Admitting: Family Medicine

## 2023-08-27 LAB — BASIC METABOLIC PANEL WITH GFR
BUN: 17 mg/dL (ref 6–23)
CO2: 29 meq/L (ref 19–32)
Calcium: 9.1 mg/dL (ref 8.4–10.5)
Chloride: 105 meq/L (ref 96–112)
Creatinine, Ser: 1.15 mg/dL (ref 0.40–1.50)
GFR: 78.89 mL/min (ref >60.00)
Glucose, Bld: 99 mg/dL (ref 70–99)
Potassium: 4.2 meq/L (ref 3.5–5.1)
Sodium: 141 meq/L (ref 135–145)

## 2023-08-27 LAB — PSA: PSA: 0.97 ng/mL (ref 0.10–4.00)

## 2023-08-28 ENCOUNTER — Encounter: Payer: Self-pay | Admitting: Family Medicine

## 2023-08-28 DIAGNOSIS — H93A9 Pulsatile tinnitus, unspecified ear: Secondary | ICD-10-CM

## 2023-09-12 ENCOUNTER — Other Ambulatory Visit (HOSPITAL_COMMUNITY): Payer: Self-pay | Admitting: Family Medicine

## 2023-09-12 ENCOUNTER — Telehealth (HOSPITAL_COMMUNITY): Payer: Self-pay | Admitting: Interventional Radiology

## 2023-09-12 DIAGNOSIS — H93A9 Pulsatile tinnitus, unspecified ear: Secondary | ICD-10-CM

## 2023-09-12 NOTE — Telephone Encounter (Signed)
 Received a new referral for this patient to Dr. Dolphus for pulsatile tinnitus. Contacted patient and made him aware that Dr. Dolphus would be leaving [facility] as of 10/06/23. We discussed other options for a physician and the patient landed on Dr. Todd Burns. I will send the referral to Dr. Burns today. Patient is in agreement with this plan of care. JM

## 2023-09-19 ENCOUNTER — Ambulatory Visit: Payer: Self-pay

## 2023-09-19 ENCOUNTER — Ambulatory Visit: Admitting: Family

## 2023-09-19 ENCOUNTER — Encounter: Payer: Self-pay | Admitting: Family

## 2023-09-19 VITALS — BP 124/68 | HR 93 | Temp 99.1°F | Ht 75.0 in | Wt 247.0 lb

## 2023-09-19 DIAGNOSIS — R051 Acute cough: Secondary | ICD-10-CM | POA: Diagnosis not present

## 2023-09-19 DIAGNOSIS — U071 COVID-19: Secondary | ICD-10-CM | POA: Diagnosis not present

## 2023-09-19 LAB — POC INFLUENZA A&B (BINAX/QUICKVUE)
Influenza A, POC: NEGATIVE
Influenza B, POC: NEGATIVE

## 2023-09-19 LAB — POC COVID19 BINAXNOW: SARS Coronavirus 2 Ag: POSITIVE — AB

## 2023-09-19 LAB — POCT RAPID STREP A (OFFICE): Rapid Strep A Screen: NEGATIVE

## 2023-09-19 MED ORDER — NIRMATRELVIR/RITONAVIR (PAXLOVID)TABLET: 3.0000 | ORAL_TABLET | Freq: Two times a day (BID) | ORAL | 0 refills | Status: AC

## 2023-09-19 NOTE — Telephone Encounter (Signed)
 FYI Only or Action Required?: Action required by provider: request for appointment.  Patient was last seen in primary care on 08/26/2023 by Copland, Harlene BROCKS, MD.  Called Nurse Triage reporting Facial Pain.  Symptoms began yesterday.  Interventions attempted: Nothing.  Symptoms are: unchanged. Started feeling bad last, sinus symptoms, ear pain.Fever.   Triage Disposition: See HCP Within 4 Hours (Or PCP Triage)  Patient/caregiver understands and will follow disposition?: Yes    Copied from CRM [id number]. Topic: Clinical - Red Word Triage >> Sep 19, 2023  8:17 AM Vena H wrote: [facility] that prompted transfer to Nurse Triage: Pt called in stating he is sick, states he has back pain, runny nose, and felt like his heart was racing last night. ----------------------------------------------------------------------- From previous Reason for Contact - Scheduling: Patient/patient representative is calling to schedule an appointment. Refer to attachments for appointment information. Reason for Disposition  [1] SEVERE sinus pain (e.g., excruciating) AND [2] not improved 2 hours after pain medicine  Answer Assessment - Initial Assessment Questions 1. LOCATION: Where does it hurt?      Head 2. ONSET: When did the sinus pain start?  (e.g., hours, days)      Last night 3. SEVERITY: How bad is the pain?   (Scale 0-10; or none, mild, moderate or severe)     4 4. RECURRENT SYMPTOM: Have you ever had sinus problems before? If Yes, ask: When was the last time? and What happened that time?      yes 5. NASAL CONGESTION: Is the nose blocked? If Yes, ask: Can you open it or must you breathe through your mouth?     no 6. NASAL DISCHARGE: Do you have discharge from your nose? If so ask, What color?     clear 7. FEVER: Do you have a fever? If Yes, ask: What is it, how was it measured, and when did it start?      101 last night 8. OTHER SYMPTOMS: Do you have any other symptoms?  (e.g., sore throat, cough, earache, difficulty breathing)     Ear ache 9. PREGNANCY: Is there any chance you are pregnant? When was your last menstrual period?     N/a  Protocols used: Sinus Pain or Congestion-A-AH

## 2023-09-19 NOTE — Progress Notes (Signed)
 NICHOLUS CHANDRAN is a 43 y.o. male with the following history as recorded in Epic[employer]:  Patient Active Problem List   Diagnosis Date Noted   Bronchitis 06/13/2023   Sore throat 06/13/2023   Acute non-recurrent pansinusitis 06/13/2023   Strep throat 06/13/2023   Decreased mobility and endurance 05/19/2023   Prediabetes 11/27/2019   Overweight 12/24/2016   Benign neoplasm of colon 08/05/2013   Family history of malignant neoplasm of gastrointestinal tract 08/05/2013    Current Outpatient Medications  Medication Sig Dispense Refill   nirmatrelvir /ritonavir  (PAXLOVID ) 20 x 150 MG & 10 x 100MG  TABS Take 3 tablets by mouth 2 (two) times daily for 5 days. (Take nirmatrelvir  150 mg two tablets twice daily for 5 days and ritonavir  100 mg one tablet twice daily for 5 days) Patient GFR is 78 30 tablet 0   No current facility-administered medications for this visit.    Allergies: Patient has no known allergies.  Past Medical History:  Diagnosis Date   Family history of colon cancer    Localized superficial swelling, mass, or lump    pt unsure what this is.    Past Surgical History:  Procedure Laterality Date   COLONOSCOPY     LAPAROSCOPIC APPENDECTOMY N/A 09/18/2018   Procedure: APPENDECTOMY LAPAROSCOPIC;  Surgeon: Ethyl Lenis, MD;  Location: [facility];  Service: General;  Laterality: N/A;   LIPOMA EXCISION     x2/ on side abdomen   TOE SURGERY  2011   Bil pinkey toe surgery/ removed the bones   VASECTOMY      Family History  Problem Relation Age of Onset   Familial polyposis Father        multiple polyps (50-60) ; unknown pathology; no cancer   Insomnia Father    Colon polyps Brother    Cancer Paternal Uncle 21       colorectal cancer   Familial polyposis Paternal Uncle 68       polyp history; unknown type or number   ALS Maternal Grandfather    Cancer Paternal Grandmother 70       colon cancer; breast cancer in 76s   Sleep apnea Paternal Grandfather    Cancer Other 35       1st  cousin once removed (pat grandmother's sister's son) with colorectal cancer    Social History   Tobacco Use   Smoking status: Never   Smokeless tobacco: Never  Substance Use Topics   Alcohol use: Yes    Comment: occasional    Subjective:   Sudden onset of cough/ congestion/ flu like symptoms; states that symptoms started suddenly last night; was worried when his pulse level got up to 100;  Patient prefers to have light off/ lay down in the room today;  He had arrived for his appointment about an hour early today;   Objective:  Vitals:   09/19/23 1045  BP: 124/68  Pulse: 93  Temp: 99.1 F (37.3 C)  TempSrc: Oral  SpO2: 98%  Weight: 247 lb (112 kg)  Height: 6' 3 (1.905 m)    General: Well developed, well nourished, in no acute distress  Skin : Warm and dry.  Head: Normocephalic and atraumatic  Lungs: Respirations unlabored; clear to auscultation bilaterally without wheeze, rales, rhonchi  CVS exam: normal rate and regular rhythm.  Neurologic: Alert and oriented; speech intact; face symmetrical; moves all extremities well; CNII-XII intact without focal deficit   Assessment:  1. COVID-19   2. Acute cough     Plan:  Per patient, this is the 6th time he has had COVID in the past 5 years; will treat with Paxlovid  which he has taken in the past; he defers prescriptive cough syrup; encouraged to remain hydrated and okay to use Tylenol / Advil  as needed; follow up worse, no better.   No follow-ups on file.  Orders Placed This Encounter  Procedures   POC COVID-19   POC Influenza A&B (Binax test)   POCT rapid strep A    Requested Prescriptions   Signed Prescriptions Disp Refills   nirmatrelvir /ritonavir  (PAXLOVID ) 20 x 150 MG & 10 x 100MG  TABS 30 tablet 0    Sig: Take 3 tablets by mouth 2 (two) times daily for 5 days. (Take nirmatrelvir  150 mg two tablets twice daily for 5 days and ritonavir  100 mg one tablet twice daily for 5 days) Patient GFR is 78

## 2023-09-19 NOTE — Patient Instructions (Signed)
 As discussed, you have COVID; please take the Paxlovid  that was prescribed for you. You need to make sure to stay hydrated and can take Advil  or Tylenol  for symptomatic relief; you should not plan to return to work until Monday at the earliest; you will need to return to the office if you are not feeling better.

## 2023-10-03 ENCOUNTER — Ambulatory Visit: Admitting: Neuroradiology

## 2023-10-03 ENCOUNTER — Encounter: Payer: Self-pay | Admitting: Neuroradiology

## 2023-10-03 VITALS — BP 124/82 | HR 86 | Wt 247.0 lb

## 2023-10-03 DIAGNOSIS — H93A9 Pulsatile tinnitus, unspecified ear: Secondary | ICD-10-CM

## 2023-10-03 NOTE — Progress Notes (Signed)
   Progress Note: Referring Physician:  Ubaldo Merlynn LABOR, MD No address on file  Primary Physician:  Watt Harlene BROCKS, MD  Chief Complaint: Audible pulsations  History of Present Illness: Carl Rivera is a 43 y.o. male who presents with the chief complaint of audible pulsations.  He hears an arterial swishing sound in the back of his head.  His wife is heard the sound before at night.  It comes and goes in terms of intensity.  He is generally very healthy.  He is very active, works out at [employer] a few times a week and does taekwondo.   He had an MRA on 04/20/2023 which I reviewed.  This shows enlarged branches of the occipital arteries, flow signal in the calvarium and some flow signal in the torcula.    Review of Systems:  Review of Systems  Respiratory:  Negative for shortness of breath.   Cardiovascular:  Negative for chest pain.    Exam: Today's Vitals   10/03/23 1337  BP: 124/82  Pulse: 86  SpO2: 95%  Weight: 247 lb (112 kg)   Body mass index is 30.87 kg/m.   Physical Exam Constitutional:      Appearance: Normal appearance. He is normal weight.  HENT:     Head:     Comments: I am unable to auscultate a bruit over his calvarium. Cardiovascular:     Rate and Rhythm: Normal rate and regular rhythm.     Heart sounds: Normal heart sounds.  Pulmonary:     Effort: Pulmonary effort is normal.     Breath sounds: Normal breath sounds.  Neurological:     Mental Status: He is alert.      NEUROLOGICAL:   Alert and oriented with normal speech expression, fluency and comprehension. Visual fields are full to confrontation.   Face is symmetric. Strength in the arms and legs is symmetric with no drift.  Sensation is normal and symmetric. No ataxia. No inattention.   Imaging:  See HPI  Assessment and Plan:  #1.  Audible pulsations, possible dural fistula involving the occiput and torcula  I spoke with him about the possible diagnosis of dural fistula.  I  have explained that these can be benign, but can also cause stroke or hemorrhage in the brain depending on the venous drainage.  My recommendation is proceeding with a cerebral arteriogram to evaluate for the possibility of a fistula, and if one is present, to evaluate the venous drainage and potential therapeutic options.  I have explained the small risk of stroke with diagnostic angiography, but that noninvasive testing is not going to provide the needed information.

## 2023-10-16 ENCOUNTER — Other Ambulatory Visit (HOSPITAL_COMMUNITY): Payer: Self-pay | Admitting: Neuroradiology

## 2023-10-16 ENCOUNTER — Telehealth (HOSPITAL_COMMUNITY): Payer: Self-pay | Admitting: Radiology

## 2023-10-16 DIAGNOSIS — H93A9 Pulsatile tinnitus, unspecified ear: Secondary | ICD-10-CM

## 2023-10-16 NOTE — Telephone Encounter (Signed)
 Called pt to schedule cerebral angio with Dr. Lester. Left VM for him to call to set this up. JM

## 2023-10-31 ENCOUNTER — Telehealth: Payer: Self-pay | Admitting: Neuroradiology

## 2023-10-31 NOTE — Telephone Encounter (Signed)
 He says that it is getting smaller over time

## 2023-10-31 NOTE — Telephone Encounter (Signed)
 Patient notified

## 2023-10-31 NOTE — Telephone Encounter (Signed)
 I spoke with Dr. Lester. He is not concerned at this time about this bulge. Plan will continue for 9/8 for his angiogram.

## 2023-10-31 NOTE — Telephone Encounter (Signed)
 Patient called and said he has an angiogram on 11/11/23 with Dr Lester and he said he noticed today because his friend noticed a bulge on his head about the size of half a golf ball behind his right ear, just below the crown of his scalp. He said it has happened in the last 24 hours and is concerned about it. Callback is [phone]

## 2023-11-01 ENCOUNTER — Other Ambulatory Visit: Payer: Self-pay

## 2023-11-01 DIAGNOSIS — H93A9 Pulsatile tinnitus, unspecified ear: Secondary | ICD-10-CM

## 2023-11-11 ENCOUNTER — Other Ambulatory Visit (HOSPITAL_COMMUNITY): Payer: Self-pay | Admitting: Neuroradiology

## 2023-11-11 ENCOUNTER — Other Ambulatory Visit: Payer: Self-pay

## 2023-11-11 ENCOUNTER — Ambulatory Visit (HOSPITAL_COMMUNITY)
Admission: RE | Admit: 2023-11-11 | Discharge: 2023-11-11 | Disposition: A | Discharge status: Home / Self Care | Source: Ambulatory Visit | Attending: Neuroradiology | Admitting: Neuroradiology

## 2023-11-11 DIAGNOSIS — H93A9 Pulsatile tinnitus, unspecified ear: Secondary | ICD-10-CM

## 2023-11-11 DIAGNOSIS — I770 Arteriovenous fistula, acquired: Secondary | ICD-10-CM | POA: Diagnosis not present

## 2023-11-11 DIAGNOSIS — I671 Cerebral aneurysm, nonruptured: Secondary | ICD-10-CM | POA: Diagnosis not present

## 2023-11-11 LAB — BASIC METABOLIC PANEL WITH GFR
Anion gap: 11 (ref 5–15)
BUN: 13 mg/dL (ref 6–20)
CO2: 24 mmol/L (ref 22–32)
Calcium: 9.3 mg/dL (ref 8.9–10.3)
Chloride: 104 mmol/L (ref 98–111)
Creatinine, Ser: 0.86 mg/dL (ref 0.61–1.24)
GFR, Estimated: >60 mL/min
Glucose, Bld: 117 mg/dL — ABNORMAL HIGH (ref 70–99)
Potassium: 4 mmol/L (ref 3.5–5.1)
Sodium: 139 mmol/L (ref 135–145)

## 2023-11-11 LAB — CBC
HCT: 43.1 % (ref 39.0–52.0)
Hemoglobin: 14.9 g/dL (ref 13.0–17.0)
MCH: 29.3 pg (ref 26.0–34.0)
MCHC: 34.6 g/dL (ref 30.0–36.0)
MCV: 84.7 fL (ref 80.0–100.0)
Platelets: 161 10*3/uL (ref 150–400)
RBC: 5.09 MIL/uL (ref 4.22–5.81)
RDW: 12.4 % (ref 11.5–15.5)
WBC: 5.5 10*3/uL (ref 4.0–10.5)
nRBC: 0 % (ref 0.0–0.2)

## 2023-11-11 MED ORDER — VERAPAMIL HCL 2.5 MG/ML IV SOLN: INTRA_ARTERIAL | Status: AC | PRN

## 2023-11-11 MED ORDER — FENTANYL CITRATE (PF) 100 MCG/2ML IJ SOLN
INTRAMUSCULAR | Status: AC | PRN
  Administered 2023-11-11: 25 ug via INTRAVENOUS

## 2023-11-11 MED ORDER — NITROGLYCERIN 1 MG/10 ML FOR IR/CATH LAB
INTRA_ARTERIAL | Status: AC
Start: 2023-11-11 — End: 2023-11-11
  Filled 2023-11-11: qty 10

## 2023-11-11 MED ORDER — HEPARIN SODIUM (PORCINE) 1000 UNIT/ML IJ SOLN
INTRAMUSCULAR | Status: AC | PRN
  Administered 2023-11-11: 3000 [IU] via INTRAVENOUS

## 2023-11-11 MED ORDER — HEPARIN SODIUM (PORCINE) 1000 UNIT/ML IJ SOLN
INTRAMUSCULAR | Status: AC
  Filled 2023-11-11: qty 10

## 2023-11-11 MED ORDER — VERAPAMIL HCL 2.5 MG/ML IV SOLN
INTRAVENOUS | Status: AC
  Filled 2023-11-11: qty 2

## 2023-11-11 MED ORDER — LIDOCAINE HCL 1 % IJ SOLN
INTRAMUSCULAR | Status: AC
Start: 2023-11-11 — End: 2023-11-11
  Filled 2023-11-11: qty 20

## 2023-11-11 MED ORDER — IOHEXOL 300 MG/ML  SOLN
150.0000 mL | Freq: Once | INTRAMUSCULAR | Status: AC | PRN
  Administered 2023-11-11: 50 mL via INTRA_ARTERIAL

## 2023-11-11 MED ORDER — LIDOCAINE HCL 1 % IJ SOLN
20.0000 mL | Freq: Once | INTRAMUSCULAR | Status: AC
  Administered 2023-11-11: 10 mL

## 2023-11-11 MED ORDER — FENTANYL CITRATE (PF) 100 MCG/2ML IJ SOLN
INTRAMUSCULAR | Status: AC
  Filled 2023-11-11: qty 2

## 2023-11-11 NOTE — Sedation Documentation (Signed)
 Patient transported to recovery area via stretcher. Bedside report given to RN. Radial site assessed - Level 0, no hematoma, TR band in place -clean, dry, and intact. Pulses also assessed bilaterally.

## 2023-11-11 NOTE — H&P (Signed)
 Referring Physician:  Ubaldo Merlynn LABOR, MD No address on file   Primary Physician:  Watt Harlene BROCKS, MD   Chief Complaint: Audible pulsations   History of Present Illness: Carl Rivera is a 43 y.o. male who presents with the chief complaint of audible pulsations.  He hears an arterial swishing sound in the back of his head.  His wife is heard the sound before at night.  It comes and goes in terms of intensity.   He is generally very healthy.  He is very active, works out at [employer] a few times a week and does taekwondo.    He had an MRA on 04/20/2023 which I reviewed.  This shows enlarged branches of the occipital arteries, flow signal in the calvarium and some flow signal in the torcula.       Review of Systems:   Review of Systems  Respiratory:  Negative for shortness of breath.   Cardiovascular:  Negative for chest pain.     Exam:    Today's Vitals    10/03/23 1337  BP: 124/82  Pulse: 86  SpO2: 95%  Weight: 247 lb (112 kg)    Body mass index is 30.87 kg/m.     Physical Exam Constitutional:      Appearance: Normal appearance. He is normal weight.  HENT:     Head:     Comments: I am unable to auscultate a bruit over his calvarium. Cardiovascular:     Rate and Rhythm: Normal rate and regular rhythm.     Heart sounds: Normal heart sounds.  Pulmonary:     Effort: Pulmonary effort is normal.     Breath sounds: Normal breath sounds.  Neurological:     Mental Status: He is alert.       NEUROLOGICAL:    Alert and oriented with normal speech expression, fluency and comprehension. Visual fields are full to confrontation.   Face is symmetric. Strength in the arms and legs is symmetric with no drift.  Sensation is normal and symmetric. No ataxia. No inattention.    Imaging:   See HPI   Assessment and Plan:   #1.  Audible pulsations, possible dural fistula involving the occiput and torcula   I spoke with him about the possible diagnosis of dural  fistula.  I have explained that these can be benign, but can also cause stroke or hemorrhage in the brain depending on the venous drainage.   My recommendation is proceeding with a cerebral arteriogram to evaluate for the possibility of a fistula, and if one is present, to evaluate the venous drainage and potential therapeutic options.   I have explained the small risk of stroke with diagnostic angiography, but that noninvasive testing is not going to provide the needed information.  ASA: 1 Mallampati: 1  I have reviewed and confirmed my history and physical from 10/03/23 with no additions or changes. Plan for cerebral arteriogram.  Risks and benefits reviewed.                  Electronically signed by Lester Golas, MD at 10/03/2023  5:20 PM

## 2023-11-11 NOTE — Brief Op Note (Signed)
  NEUROSURGERY BRIEF OP NOTE   PREOP DX: dural fistula  POSTOP DX: Same  PROCEDURE: cerebral arteriogram  SURGEON: Nancyann LULLA Burns   ANESTHESIA: IV Sedation with Local  EBL: 25 ml  COMPLICATIONS: None  CONDITION: Stable to recovery  FINDINGS (Full report in [facility]): 1. Dural fistula on torcula   Nancyann LULLA Burns  @today @ 9:45 AM

## 2023-11-11 NOTE — Progress Notes (Signed)
 He feels well.  We discussed the diagnosis and treatment in general.  I will schedule an office appointment for pre-op and to go over everything in detail.

## 2023-11-14 ENCOUNTER — Ambulatory Visit: Admitting: Neuroradiology

## 2023-11-14 ENCOUNTER — Encounter: Payer: Self-pay | Admitting: Neuroradiology

## 2023-11-14 VITALS — BP 137/89 | HR 76 | Ht 75.0 in | Wt 247.0 lb

## 2023-11-14 DIAGNOSIS — H93A9 Pulsatile tinnitus, unspecified ear: Secondary | ICD-10-CM | POA: Diagnosis not present

## 2023-11-14 DIAGNOSIS — I770 Arteriovenous fistula, acquired: Secondary | ICD-10-CM | POA: Diagnosis not present

## 2023-11-14 NOTE — Progress Notes (Signed)
   Progress Note: Referring Physician:  Watt Harlene BROCKS, MD [address] [place],  [place] [address]  Chief Complaint: Audible pulsations  History of Present Illness: Carl Rivera is a 43 y.o. male who presents with the chief complaint of audible pulsations.  He is here today to discuss the diagnosis of dural arteriovenous fistula and treatment options  Review of Systems:  Review of Systems  Respiratory:  Negative for shortness of breath.   Cardiovascular:  Negative for chest pain.    Exam: Today's Vitals   11/14/23 1458  BP: 137/89  Pulse: 76  SpO2: 95%  Weight: 247 lb (112 kg)  Height: 6' 3 (1.905 m)   Body mass index is 30.87 kg/m.   Physical Exam Constitutional:      Appearance: Normal appearance. He is normal weight.  Cardiovascular:     Rate and Rhythm: Normal rate and regular rhythm.     Pulses: Normal pulses.     Heart sounds: Normal heart sounds.  Pulmonary:     Effort: Pulmonary effort is normal.     Breath sounds: Normal breath sounds.  Abdominal:     Palpations: Abdomen is soft.  Neurological:     Mental Status: He is alert.     Comments: Alert and oriented with normal speech expression, fluency and comprehension. Visual fields are full to confrontation.   Face is symmetric. Strength in the arms and legs is symmetric with no drift.  Sensation is normal and symmetric. No ataxia. No inattention.     ASA: 1 Mallampati: 1   Imaging:  His arteriogram demonstrates a dural arteriovenous fistula near the torcula with flow to the torcula and transverse sinuses.  Assessment and Plan:  Dural arteriovenous fistula with audible pulsations.  I explained to the patient and his wife that the abnormality is not likely to resolve on its own.  It is likely to recruit more blood flow and enlarge over time.  The audible pulsations may or may not get worse.  Another concern is venous hypertension in the brain, which will probably not  significant at this point, could develop over time, especially given his young age.  I talked to him about the treatment of dural fistulas, specifically catheter embolization which is the treatment of choice.  I have estimated the chance of success around 90%, and estimated the risk of a serious complication from the procedure around 2 to 3%.  The latter could include disabling stroke.  He understands the above and would like to proceed with the embolization.  He will schedule this when it is convenient for him.  The procedure will be with general anesthesia.

## 2023-11-15 ENCOUNTER — Telehealth: Payer: Self-pay | Admitting: Neuroradiology

## 2023-11-15 NOTE — Telephone Encounter (Signed)
 Patient called to follow up on his appointment from yesterday and just wanted to see what the next step is since Dr Lester mentioned a surgery or procedure, He wants to know if he needs to call someone or if he will be called. Please advise

## 2023-11-15 NOTE — Telephone Encounter (Signed)
 Message was sent to IR to coordinate scheduling.

## 2023-11-18 NOTE — Telephone Encounter (Signed)
 Clarification sent to IR staff so that procedure can be scheduled.

## 2023-11-19 ENCOUNTER — Other Ambulatory Visit: Payer: Self-pay

## 2023-11-19 ENCOUNTER — Other Ambulatory Visit (HOSPITAL_COMMUNITY): Payer: Self-pay | Admitting: Neuroradiology

## 2023-11-19 DIAGNOSIS — H93A9 Pulsatile tinnitus, unspecified ear: Secondary | ICD-10-CM

## 2023-11-19 DIAGNOSIS — I671 Cerebral aneurysm, nonruptured: Secondary | ICD-10-CM

## 2023-11-19 DIAGNOSIS — I770 Arteriovenous fistula, acquired: Secondary | ICD-10-CM

## 2023-11-21 NOTE — Telephone Encounter (Signed)
 Patient scheduled for 12/23/2023.

## 2023-11-27 ENCOUNTER — Encounter: Admitting: Neuroradiology

## 2023-12-10 ENCOUNTER — Other Ambulatory Visit: Payer: Self-pay

## 2023-12-10 DIAGNOSIS — I770 Arteriovenous fistula, acquired: Secondary | ICD-10-CM

## 2023-12-20 ENCOUNTER — Other Ambulatory Visit: Payer: Self-pay

## 2023-12-20 ENCOUNTER — Encounter (HOSPITAL_COMMUNITY): Payer: Self-pay | Admitting: Neuroradiology

## 2023-12-20 NOTE — Progress Notes (Signed)
 PCP - Harlene Schroeder, MD  CPAP - Does not require CPAP  Anesthesia review: N  Patient verbally denies any shortness of breath, fever, cough and chest pain during phone call   -------------  SDW INSTRUCTIONS given:  Your procedure is scheduled on Monday, Oct 20th.  Report to [facility] Main Entrance A at 0530 A.M., and check in at the Admitting office.  Call this number if you have problems the morning of surgery:  [phone]   Remember:  Do not eat or drink after midnight the night before your surgery    Take these medicines the morning of surgery with A SIP OF WATER  N/A  As of today, STOP taking any Aspirin (unless otherwise instructed by your surgeon) Aleve, Naproxen, Ibuprofen , Motrin , Advil , Goody's, BC's, all herbal medications, fish oil, and all vitamins.                      Do not wear jewelry, make up, or nail polish            Do not wear lotions, powders, perfumes/colognes, or deodorant.            Do not shave 48 hours prior to surgery.  Men may shave face and neck.            Do not bring valuables to the hospital.            [facility] is not responsible for any belongings or valuables.  Do NOT Smoke (Tobacco/Vaping) 24 hours prior to your procedure If you use a CPAP at night, you may bring all equipment for your overnight stay.   Contacts, glasses, dentures or bridgework may not be worn into surgery.      For patients admitted to the hospital, discharge time will be determined by your treatment team.   Patients discharged the day of surgery will not be allowed to drive home, and someone needs to stay with them for 24 hours.    Special instructions:   [place]- Preparing For Surgery  Before surgery, you can play an important role. Because skin is not sterile, your skin needs to be as free of germs as possible. You can reduce the number of germs on your skin by washing with CHG (chlorahexidine gluconate) Soap before surgery.  CHG is an antiseptic  cleaner which kills germs and bonds with the skin to continue killing germs even after washing.    Oral Hygiene is also important to reduce your risk of infection.  Remember - BRUSH YOUR TEETH THE MORNING OF SURGERY WITH YOUR REGULAR TOOTHPASTE  Please do not use if you have an allergy to CHG or antibacterial soaps. If your skin becomes reddened/irritated stop using the CHG.  Do not shave (including legs and underarms) for at least 48 hours prior to first CHG shower. It is OK to shave your face.  Please follow these instructions carefully.   Shower the NIGHT BEFORE SURGERY and the MORNING OF SURGERY with DIAL Soap.   Pat yourself dry with a CLEAN TOWEL.  Wear CLEAN PAJAMAS to bed the night before surgery  Place CLEAN SHEETS on your bed the night of your first shower and DO NOT SLEEP WITH PETS.   Day of Surgery: Please shower morning of surgery  Wear Clean/Comfortable clothing the morning of surgery Do not apply any deodorants/lotions.   Remember to brush your teeth WITH YOUR REGULAR TOOTHPASTE.   Questions were answered. Patient verbalized understanding of instructions.

## 2023-12-22 NOTE — Anesthesia Preprocedure Evaluation (Signed)
 Anesthesia Evaluation  Patient identified by MRN, date of birth, ID band Patient awake    Reviewed: Allergy & Precautions, NPO status , Patient's Chart, lab work & pertinent test results, reviewed documented beta blocker date and time   History of Anesthesia Complications Negative for: history of anesthetic complications  Airway Mallampati: II  TM Distance: >3 FB Neck ROM: Full    Dental no notable dental hx.    Pulmonary sleep apnea    Pulmonary exam normal breath sounds clear to auscultation       Cardiovascular hypertension, (-) Past MI Normal cardiovascular exam Rhythm:Regular Rate:Normal     Neuro/Psych neg Seizures dural av fistula    GI/Hepatic negative GI ROS, Neg liver ROS,,,  Endo/Other  negative endocrine ROS    Renal/GU negative Renal ROS  negative genitourinary   Musculoskeletal negative musculoskeletal ROS (+)    Abdominal   Peds  Hematology negative hematology ROS (+)   Anesthesia Other Findings   Reproductive/Obstetrics                              Anesthesia Physical Anesthesia Plan  ASA: 2  Anesthesia Plan: General   Post-op Pain Management: Minimal or no pain anticipated   Induction: Intravenous  PONV Risk Score and Plan: 2 and Ondansetron , Dexamethasone , Midazolam  and Treatment may vary due to age or medical condition  Airway Management Planned: Oral ETT  Additional Equipment:   Intra-op Plan:   Post-operative Plan: Extubation in OR  Informed Consent: I have reviewed the patients History and Physical, chart, labs and discussed the procedure including the risks, benefits and alternatives for the proposed anesthesia with the patient or authorized representative who has indicated his/her understanding and acceptance.       Plan Discussed with: CRNA  Anesthesia Plan Comments:          Anesthesia Quick Evaluation

## 2023-12-23 ENCOUNTER — Other Ambulatory Visit: Payer: Self-pay

## 2023-12-23 ENCOUNTER — Observation Stay (HOSPITAL_COMMUNITY)
Admission: EM | Admit: 2023-12-23 | Discharge: 2023-12-23 | Disposition: A | Discharge status: Home / Self Care | Attending: Neuroradiology | Admitting: Neuroradiology

## 2023-12-23 ENCOUNTER — Encounter (HOSPITAL_COMMUNITY): Payer: Self-pay | Admitting: Neuroradiology

## 2023-12-23 ENCOUNTER — Ambulatory Visit (HOSPITAL_COMMUNITY)
Admission: RE | Admit: 2023-12-23 | Discharge: 2023-12-23 | Disposition: A | Source: Ambulatory Visit | Attending: Neuroradiology | Admitting: Neuroradiology

## 2023-12-23 ENCOUNTER — Encounter (HOSPITAL_COMMUNITY)
Admission: EM | Disposition: A | Discharge status: Home / Self Care | Payer: Self-pay | Source: Home / Self Care | Attending: Neuroradiology

## 2023-12-23 ENCOUNTER — Ambulatory Visit: Payer: Self-pay | Admitting: Neuroradiology

## 2023-12-23 ENCOUNTER — Ambulatory Visit (HOSPITAL_COMMUNITY): Payer: Self-pay

## 2023-12-23 DIAGNOSIS — I671 Cerebral aneurysm, nonruptured: Secondary | ICD-10-CM | POA: Diagnosis not present

## 2023-12-23 DIAGNOSIS — H93A9 Pulsatile tinnitus, unspecified ear: Secondary | ICD-10-CM

## 2023-12-23 DIAGNOSIS — I770 Arteriovenous fistula, acquired: Secondary | ICD-10-CM | POA: Diagnosis not present

## 2023-12-23 DIAGNOSIS — I10 Essential (primary) hypertension: Secondary | ICD-10-CM | POA: Insufficient documentation

## 2023-12-23 DIAGNOSIS — G4730 Sleep apnea, unspecified: Secondary | ICD-10-CM | POA: Insufficient documentation

## 2023-12-23 DIAGNOSIS — Z9889 Other specified postprocedural states: Secondary | ICD-10-CM

## 2023-12-23 HISTORY — DX: Prediabetes: R73.03

## 2023-12-23 HISTORY — DX: Essential (primary) hypertension: I10

## 2023-12-23 HISTORY — DX: COVID-19: U07.1

## 2023-12-23 HISTORY — DX: Sleep apnea, unspecified: G47.30

## 2023-12-23 HISTORY — PX: IR NEURO EACH ADD'L AFTER BASIC UNI RIGHT (MS): IMG5374

## 2023-12-23 HISTORY — PX: IR ANGIO EXTERNAL CAROTID SEL EXT CAROTID UNI R MOD SED: IMG5371

## 2023-12-23 LAB — BASIC METABOLIC PANEL WITH GFR
Anion gap: 12 (ref 5–15)
BUN: 10 mg/dL (ref 6–20)
CO2: 22 mmol/L (ref 22–32)
Calcium: 8.8 mg/dL — ABNORMAL LOW (ref 8.9–10.3)
Chloride: 103 mmol/L (ref 98–111)
Creatinine, Ser: 0.87 mg/dL (ref 0.61–1.24)
GFR, Estimated: >60 mL/min (ref >60)
Glucose, Bld: 116 mg/dL — ABNORMAL HIGH (ref 70–99)
Potassium: 4 mmol/L (ref 3.5–5.1)
Sodium: 137 mmol/L (ref 135–145)

## 2023-12-23 LAB — POCT ACTIVATED CLOTTING TIME: Activated Clotting Time: 204 s

## 2023-12-23 LAB — CBC
HCT: 43.6 % (ref 39.0–52.0)
Hemoglobin: 15 g/dL (ref 13.0–17.0)
MCH: 29.4 pg (ref 26.0–34.0)
MCHC: 34.4 g/dL (ref 30.0–36.0)
MCV: 85.5 fL (ref 80.0–100.0)
Platelets: 145 K/uL — ABNORMAL LOW (ref 150–400)
RBC: 5.1 MIL/uL (ref 4.22–5.81)
RDW: 12 % (ref 11.5–15.5)
WBC: 5.3 K/uL (ref 4.0–10.5)
nRBC: 0 % (ref 0.0–0.2)

## 2023-12-23 LAB — TYPE AND SCREEN
ABO/RH(D): A NEG
Antibody Screen: NEGATIVE

## 2023-12-23 LAB — ABO/RH: ABO/RH(D): A NEG

## 2023-12-23 SURGERY — RADIOLOGY WITH ANESTHESIA
Anesthesia: General

## 2023-12-23 MED ORDER — OXYCODONE HCL 5 MG PO TABS: 5.0000 mg | ORAL_TABLET | Freq: Once | ORAL | Status: DC | PRN

## 2023-12-23 MED ORDER — DEXAMETHASONE SOD PHOSPHATE PF 10 MG/ML IJ SOLN
INTRAMUSCULAR | Status: DC | PRN
  Administered 2023-12-23: 10 mg via INTRAVENOUS

## 2023-12-23 MED ORDER — PROPOFOL 10 MG/ML IV BOLUS
INTRAVENOUS | Status: DC | PRN
  Administered 2023-12-23: 200 mg via INTRAVENOUS

## 2023-12-23 MED ORDER — FENTANYL CITRATE (PF) 100 MCG/2ML IJ SOLN
INTRAMUSCULAR | Status: AC
  Filled 2023-12-23: qty 2

## 2023-12-23 MED ORDER — VERAPAMIL HCL 2.5 MG/ML IV SOLN
INTRAVENOUS | Status: AC
  Filled 2023-12-23: qty 2

## 2023-12-23 MED ORDER — ONDANSETRON HCL 4 MG/2ML IJ SOLN
INTRAMUSCULAR | Status: DC | PRN
  Administered 2023-12-23: 4 mg via INTRAVENOUS

## 2023-12-23 MED ORDER — SUGAMMADEX SODIUM 200 MG/2ML IV SOLN
INTRAVENOUS | Status: DC | PRN
  Administered 2023-12-23: 400 mg via INTRAVENOUS

## 2023-12-23 MED ORDER — DROPERIDOL 2.5 MG/ML IJ SOLN: 0.6250 mg | Freq: Once | INTRAMUSCULAR | Status: DC | PRN

## 2023-12-23 MED ORDER — IOHEXOL 300 MG/ML  SOLN
150.0000 mL | Freq: Once | INTRAMUSCULAR | Status: AC | PRN
  Administered 2023-12-23: 48 mL via INTRA_ARTERIAL

## 2023-12-23 MED ORDER — FENTANYL CITRATE (PF) 250 MCG/5ML IJ SOLN
INTRAMUSCULAR | Status: DC | PRN
  Administered 2023-12-23 (×2): 50 ug via INTRAVENOUS

## 2023-12-23 MED ORDER — NITROGLYCERIN 1 MG/10 ML FOR IR/CATH LAB
INTRA_ARTERIAL | Status: AC
  Filled 2023-12-23: qty 10

## 2023-12-23 MED ORDER — CHLORHEXIDINE GLUCONATE 0.12 % MT SOLN
15.0000 mL | Freq: Once | OROMUCOSAL | Status: AC
  Administered 2023-12-23: 15 mL via OROMUCOSAL
  Filled 2023-12-23: qty 15

## 2023-12-23 MED ORDER — LIDOCAINE 2% (20 MG/ML) 5 ML SYRINGE
INTRAMUSCULAR | Status: DC | PRN
  Administered 2023-12-23: 100 mg via INTRAVENOUS

## 2023-12-23 MED ORDER — ACETAMINOPHEN 10 MG/ML IV SOLN: 1000.0000 mg | Freq: Once | INTRAVENOUS | Status: DC | PRN

## 2023-12-23 MED ORDER — MIDAZOLAM HCL (PF) 2 MG/2ML IJ SOLN
INTRAMUSCULAR | Status: DC | PRN
  Administered 2023-12-23: 2 mg via INTRAVENOUS

## 2023-12-23 MED ORDER — PHENYLEPHRINE 80 MCG/ML (10ML) SYRINGE FOR IV PUSH (FOR BLOOD PRESSURE SUPPORT)
PREFILLED_SYRINGE | INTRAVENOUS | Status: DC | PRN
  Administered 2023-12-23: 80 ug via INTRAVENOUS

## 2023-12-23 MED ORDER — OXYCODONE HCL 5 MG/5ML PO SOLN: 5.0000 mg | Freq: Once | ORAL | Status: DC | PRN

## 2023-12-23 MED ORDER — ROCURONIUM BROMIDE 10 MG/ML (PF) SYRINGE
PREFILLED_SYRINGE | INTRAVENOUS | Status: DC | PRN
Start: 2023-12-23 — End: 2023-12-23
  Administered 2023-12-23: 60 mg via INTRAVENOUS
  Administered 2023-12-23: 30 mg via INTRAVENOUS
  Administered 2023-12-23: 40 mg via INTRAVENOUS

## 2023-12-23 MED ORDER — HEPARIN SODIUM (PORCINE) 1000 UNIT/ML IJ SOLN
INTRAMUSCULAR | Status: DC | PRN
  Administered 2023-12-23: 1000 [IU] via INTRAVENOUS
  Administered 2023-12-23: 5000 [IU] via INTRAVENOUS

## 2023-12-23 MED ORDER — VERAPAMIL HCL 2.5 MG/ML IV SOLN: INTRA_ARTERIAL | Status: AC | PRN

## 2023-12-23 MED ORDER — ORAL CARE MOUTH RINSE: 15.0000 mL | Freq: Once | OROMUCOSAL | Status: AC

## 2023-12-23 MED ORDER — LACTATED RINGERS IV SOLN: INTRAVENOUS | Status: DC

## 2023-12-23 MED ORDER — HEPARIN SODIUM (PORCINE) 1000 UNIT/ML IJ SOLN
INTRAMUSCULAR | Status: AC
  Filled 2023-12-23: qty 10

## 2023-12-23 MED ORDER — FENTANYL CITRATE (PF) 100 MCG/2ML IJ SOLN
25.0000 ug | INTRAMUSCULAR | Status: DC | PRN
  Administered 2023-12-23: 25 ug via INTRAVENOUS

## 2023-12-23 MED ORDER — MIDAZOLAM HCL 2 MG/2ML IJ SOLN
INTRAMUSCULAR | Status: AC
  Filled 2023-12-23: qty 2

## 2023-12-23 NOTE — H&P (Signed)
   The note originally documented on this encounter has been moved the the encounter in which it belongs.

## 2023-12-23 NOTE — Progress Notes (Signed)
 Pt arrived to unit, oriented to floor, and CCMD was notified. BP (!) 140/85 (BP Location: Left Arm)   Pulse 71   Temp 97.8 F (36.6 C) (Oral)   Resp 18   Ht 6' 3 (1.905 m)   Wt 108.9 kg   SpO2 98%   BMI 30.00 kg/m

## 2023-12-23 NOTE — Transfer of Care (Signed)
 Immediate Anesthesia Transfer of Care Note  Patient: Carl Rivera  Procedure(s) Performed: RADIOLOGY WITH ANESTHESIA  Patient Location: PACU  Anesthesia Type:General  Level of Consciousness: awake, oriented, and drowsy  Airway & Oxygen Therapy: Patient Spontanous Breathing  Post-op Assessment: Report given to RN, Post -op Vital signs reviewed and stable, and Patient moving all extremities  Post vital signs: Reviewed and stable  Last Vitals:  Vitals Value Taken Time  BP 146/81 12/23/23 09:49  Temp    Pulse 68 12/23/23 09:52  Resp 14 12/23/23 09:52  SpO2 98 % 12/23/23 09:52  Vitals shown include unfiled device data.  Last Pain:  Vitals:   12/23/23 0702  TempSrc:   PainSc: 0-No pain         Complications: No notable events documented.

## 2023-12-23 NOTE — H&P (Signed)
  I have reviewed and confirmed my history and physical from 11/14/23 with no additions or changes. Plan for embolization DAVF from right radial access.  Risks and benefits reviewed.            Referring Physician:  Watt Harlene BROCKS, MD [address] [place],  [place] [address]   Chief Complaint: Audible pulsations   History of Present Illness: Carl Rivera is a 43 y.o. male who presents with the chief complaint of audible pulsations.  He is here today to discuss the diagnosis of dural arteriovenous fistula and treatment options   Review of Systems:   Review of Systems  Respiratory:  Negative for shortness of breath.   Cardiovascular:  Negative for chest pain.     Exam:    Today's Vitals    11/14/23 1458  BP: 137/89  Pulse: 76  SpO2: 95%  Weight: 247 lb (112 kg)  Height: 6' 3 (1.905 m)    Body mass index is 30.87 kg/m.     Physical Exam Constitutional:      Appearance: Normal appearance. He is normal weight.  Cardiovascular:     Rate and Rhythm: Normal rate and regular rhythm.     Pulses: Normal pulses.     Heart sounds: Normal heart sounds.  Pulmonary:     Effort: Pulmonary effort is normal.     Breath sounds: Normal breath sounds.  Abdominal:     Palpations: Abdomen is soft.  Neurological:     Mental Status: He is alert.     Comments: Alert and oriented with normal speech expression, fluency and comprehension. Visual fields are full to confrontation.   Face is symmetric. Strength in the arms and legs is symmetric with no drift.  Sensation is normal and symmetric. No ataxia. No inattention.      ASA: 1 Mallampati: 1    Imaging:   His arteriogram demonstrates a dural arteriovenous fistula near the torcula with flow to the torcula and transverse sinuses.   Assessment and Plan:   Dural arteriovenous fistula with audible pulsations.   I explained to the patient and his wife that the abnormality is not likely to resolve on its own.   It is likely to recruit more blood flow and enlarge over time.  The audible pulsations may or may not get worse.  Another concern is venous hypertension in the brain, which will probably not significant at this point, could develop over time, especially given his young age.   I talked to him about the treatment of dural fistulas, specifically catheter embolization which is the treatment of choice.  I have estimated the chance of success around 90%, and estimated the risk of a serious complication from the procedure around 2 to 3%.  The latter could include disabling stroke.   He understands the above and would like to proceed with the embolization.  He will schedule this when it is convenient for him.  The procedure will be with general anesthesia.            Electronically signed by Lester Golas, MD at 11/14/2023  5:56 PM

## 2023-12-23 NOTE — Op Note (Signed)
  NEUROSURGERY BRIEF OP NOTE   PREOP DX: Dural fistula  POSTOP DX: Same  PROCEDURE: Embolization with onyx  SURGEON: Nancyann LULLA Burns   ANESTHESIA: GETA  EBL: minimal  COMPLICATIONS: No immediate  CONDITION: Stable  FINDINGS:  Dural fistula occluded with onyx.   Nancyann LULLA Burns  @today @ 10:19 AM

## 2023-12-23 NOTE — Progress Notes (Signed)
 Discharge   Patient expressed verbal understanding of discharge POC.   Patient given time to ask any questions.  Additional education included in AVS.  Alert oriented in good spirits.   Tele (monitor) CCMD/ [place].  and PIV removed.  Pressure dressings intact.    Patient getting dressed.

## 2023-12-23 NOTE — Discharge Summary (Signed)
 Physician Discharge Summary  Patient ID: Carl Rivera MRN: [id number] DOB/AGE: 43/06/1980 42 y.o.  Admit date: 12/23/2023 Discharge date: 12/23/2023  Admission Diagnoses:  Discharge Diagnoses:  Principal Problem:   Cerebrovascular dural arteriovenous fistula Active Problems:   Dural arteriovenous fistula   Discharged Condition: good  Hospital Course: He was admitted for embolization of a dural arteriovenous fistula near the torcula.  Fistula was embolized through the ascending pharyngeal artery which supplied a meningeal branch to the fistula.  The procedure was uneventful and successful.  It was done through a right radial access.  He recovered well with only a minor headache as a complaint.  No neurologic complaints.  Consults: None  Significant Diagnostic Studies: None  Treatments: surgery: As above, embolization of dural arteriovenous fistula  Discharge Exam: Blood pressure (!) 135/90, pulse 74, temperature 98 F (36.7 C), temperature source Oral, resp. rate 20, height 6' 3 (1.905 m), weight 108.9 kg, SpO2 96%. General appearance: alert and no distress Resp: clear to auscultation bilaterally Cardio: regular rate and rhythm, S1, S2 normal, no murmur, click, rub or gallop Neurologic: Mental status: Alert, oriented, thought content appropriate,   Sensory: normal, no neglect Motor: grossly normal  Disposition: Discharge disposition: 01-Home or Self Care       Discharge Instructions     Call MD for:  redness, tenderness, or signs of infection (pain, swelling, redness, odor or green/yellow discharge around incision site)   Complete by: As directed    Call MD for:  severe uncontrolled pain   Complete by: As directed    Call MD for:  temperature >100.4   Complete by: As directed    Diet - low sodium heart healthy   Complete by: As directed    Diet general   Complete by: As directed    Increase activity slowly   Complete by: As directed    Remove dressing in 24  hours   Complete by: As directed       Allergies as of 12/23/2023   No Known Allergies      Medication List    You have not been prescribed any medications.      Signed: Nancyann LULLA Burns 12/23/2023, 4:52 PM

## 2023-12-23 NOTE — Anesthesia Postprocedure Evaluation (Signed)
 Anesthesia Post Note  Patient: Carl Rivera  Procedure(s) Performed: RADIOLOGY WITH ANESTHESIA     Patient location during evaluation: PACU Anesthesia Type: General Level of consciousness: awake and alert Pain management: pain level controlled Vital Signs Assessment: post-procedure vital signs reviewed and stable Respiratory status: spontaneous breathing, nonlabored ventilation, respiratory function stable and patient connected to nasal cannula oxygen Cardiovascular status: blood pressure returned to baseline and stable Postop Assessment: no apparent nausea or vomiting Anesthetic complications: no   No notable events documented.  Last Vitals:  Vitals:   12/23/23 1100 12/23/23 1115  BP: (!) 141/95 (!) 141/97  Pulse: (!) 54 64  Resp: 13 15  Temp:    SpO2: 98% 100%    Last Pain:  Vitals:   12/23/23 1100  TempSrc:   PainSc: 0-No pain                 Thom JONELLE Peoples

## 2023-12-23 NOTE — Sedation Documentation (Signed)
ACT 204

## 2023-12-23 NOTE — Sedation Documentation (Signed)
 Pt under care of anesthesia. See CRNA charting.

## 2023-12-23 NOTE — Anesthesia Procedure Notes (Signed)
 Procedure Name: Intubation Date/Time: 12/23/2023 8:14 AM  Performed by: Jerl Donald LABOR, CRNAPre-anesthesia Checklist: Patient identified, Emergency Drugs available, Suction available and Patient being monitored Patient Re-evaluated:Patient Re-evaluated prior to induction Oxygen Delivery Method: Circle System Utilized Preoxygenation: Pre-oxygenation with 100% oxygen Induction Type: IV induction Ventilation: Two handed mask ventilation required and Oral airway inserted - appropriate to patient size Laryngoscope Size: Mac and 4 Grade View: Grade I Tube type: Oral Tube size: 7.5 mm Number of attempts: 1 Airway Equipment and Method: Stylet and Oral airway Placement Confirmation: ETT inserted through vocal cords under direct vision, positive ETCO2 and breath sounds checked- equal and bilateral Secured at: 24 cm Tube secured with: Tape Dental Injury: Teeth and Oropharynx as per pre-operative assessment

## 2023-12-24 ENCOUNTER — Encounter (HOSPITAL_COMMUNITY): Payer: Self-pay | Admitting: Neuroradiology

## 2023-12-25 ENCOUNTER — Encounter (HOSPITAL_COMMUNITY): Payer: Self-pay

## 2023-12-25 ENCOUNTER — Other Ambulatory Visit (HOSPITAL_COMMUNITY): Payer: Self-pay | Admitting: Neuroradiology

## 2023-12-25 DIAGNOSIS — Z9889 Other specified postprocedural states: Secondary | ICD-10-CM

## 2024-01-29 ENCOUNTER — Encounter: Payer: Self-pay | Admitting: Neuroradiology

## 2024-01-29 ENCOUNTER — Ambulatory Visit (INDEPENDENT_AMBULATORY_CARE_PROVIDER_SITE_OTHER): Admitting: Neuroradiology

## 2024-01-29 VITALS — BP 137/82 | HR 72 | Ht 75.0 in | Wt 241.0 lb

## 2024-01-29 DIAGNOSIS — I770 Arteriovenous fistula, acquired: Secondary | ICD-10-CM

## 2024-01-29 NOTE — Progress Notes (Signed)
 I had the pleasure of seeing Carl Rivera in the office today for follow-up after embolization of his dural arteriovenous fistula.  He has made an uneventful recovery.  He initially had some fatigue which he attributes to the anesthesia, but now feels back to 100%.  He is audible pulsations have resolved.  We discussed the need for follow-up to be sure that there is no recurrence.  This will require an arteriogram 6 months from now.  If there is no recurrence at that point, further follow-up will not be necessary.

## 2024-02-06 ENCOUNTER — Other Ambulatory Visit: Payer: Self-pay

## 2024-02-06 DIAGNOSIS — I770 Arteriovenous fistula, acquired: Secondary | ICD-10-CM

## 2024-04-04 ENCOUNTER — Telehealth: Admitting: Family Medicine

## 2024-04-04 ENCOUNTER — Encounter: Payer: Self-pay | Admitting: Family Medicine

## 2024-04-04 DIAGNOSIS — J111 Influenza due to unidentified influenza virus with other respiratory manifestations: Secondary | ICD-10-CM

## 2024-04-04 MED ORDER — OSELTAMIVIR PHOSPHATE 75 MG PO CAPS: 75.0000 mg | ORAL_CAPSULE | Freq: Two times a day (BID) | ORAL | 0 refills | Status: AC

## 2024-04-04 MED ORDER — PROMETHAZINE-DM 6.25-15 MG/5ML PO SYRP: 5.0000 mL | ORAL_SOLUTION | Freq: Four times a day (QID) | ORAL | 0 refills | Status: AC | PRN

## 2024-04-04 NOTE — Patient Instructions (Signed)
 The Flu (Influenza) in Adults: What to Know Influenza is also called the flu. It's an infection that affects your respiratory tract. This includes your nose, throat, windpipe, and lungs. The flu is contagious. This means it spreads easily from person to person. It causes symptoms that are like a cold. It can also cause a high fever and body aches. What are the causes? The flu is caused by the influenza virus. You can get it by: Breathing in droplets that are in the air after an infected person coughs or sneezes. Touching something that has the virus on it and then touching your mouth, nose, or eyes. What increases the risk? You may be more likely to get the flu if: You don't wash your hands often. You're near a lot of people during cold and flu season. You touch your mouth, eyes, or nose without washing your hands first. You don't get a flu shot each year. You may also be more at risk for the flu and serious problems, such as a lung infection called pneumonia, if: You're older than 65. You're pregnant. Your immune system is weak. Your immune system is your body's defense system. You have a long-term, or chronic, condition, such as: Heart, kidney, or lung disease. Diabetes. A liver disorder. Asthma. You're very overweight. You have anemia. This is when you don't have enough red blood cells in your body. What are the signs or symptoms? Flu symptoms often start all of a sudden. They may last 4-14 days and include: Fever and chills. Headaches, body aches, or muscle aches. Sore throat. Cough. Runny or stuffy nose. Discomfort in your chest. Not wanting to eat as much as normal. Feeling weak or tired. Feeling dizzy. Nausea or vomiting. How is this diagnosed? The flu may be diagnosed based on your symptoms and medical history. You may also have a physical exam. A swab may be taken from your nose or throat and tested for the virus. How is this treated? If the flu is found early, you can  be treated with antiviral medicine. This may be given to you by mouth or through an IV. It can help you feel less sick and get better faster. Taking care of yourself at home can also help your symptoms get better. Your health care provider may tell you to: Take over-the-counter medicines. Drink lots of fluids. The flu often goes away on its own. If you have very bad symptoms or problems caused by the flu, you may need to be treated in a hospital. Follow these instructions at home: Activity Rest as needed. Get lots of sleep. Stay home from work or school as told by your provider. Leave home only to go see your provider. Do not leave home for other reasons until you don't have a fever for 24 hours without taking medicine. Eating and drinking Take an oral rehydration solution (ORS). This is a drink that is sold at pharmacies and stores. Drink enough fluid to keep your pee pale yellow. Try to drink small amounts of clear fluids. These include water, ice chips, fruit juice mixed with water, and low-calorie sports drinks. Try to eat bland foods that are easy to digest. These include bananas, applesauce, rice, lean meats, toast, and crackers. Avoid drinks that have a lot of sugar or caffeine in them. These include energy drinks, regular sports drinks, and soda. Do not drink alcohol. Do not eat spicy or fatty foods. General instructions     Take your medicines only as told by your  provider. Use a cool mist humidifier to add moisture to the air in your home. This can make it easier for you to breathe. You should also clean the humidifier every day. To do so: Empty the water. Pour clean water in. Cover your mouth and nose when you cough or sneeze. Wash your hands with soap and water often and for at least 20 seconds. It's extra important to do so after you cough or sneeze. If you can't use soap and water, use hand sanitizer. How is this prevented?  Get a flu shot every year. Ask your provider  when you should get your flu shot. Stay away from people who are sick during fall and winter. Fall and winter are cold and flu season. Contact a health care provider if: You get new symptoms. You have chest pain. You have watery poop, also called diarrhea. You have a fever. Your cough gets worse. You start to have more mucus. You feel like you may vomit, or you vomit. Get help right away if: You become short of breath or have trouble breathing. Your skin or nails turn blue. You have very bad pain or stiffness in your neck. You get a sudden headache or pain in your face or ear. You vomit each time you eat or drink. These symptoms may be an emergency. Call 911 right away. Do not wait to see if the symptoms will go away. Do not drive yourself to the hospital. This information is not intended to replace advice given to you by your health care provider. Make sure you discuss any questions you have with your health care provider. Document Revised: 12/31/2023 Document Reviewed: 03/29/2022 Elsevier Patient Education  2025 [employer].

## 2024-04-04 NOTE — Progress Notes (Signed)
 " Virtual Visit Consent   Carl Rivera, you are scheduled for a virtual visit with a [facility] provider today. Just as with appointments in the office, your consent must be obtained to participate. Your consent will be active for this visit and any virtual visit you may have with one of our providers in the next 365 days. If you have a MyChart account, a copy of this consent can be sent to you electronically.  As this is a virtual visit, video technology does not allow for your provider to perform a traditional examination. This may limit your provider's ability to fully assess your condition. If your provider identifies any concerns that need to be evaluated in person or the need to arrange testing (such as labs, EKG, etc.), we will make arrangements to do so. Although advances in technology are sophisticated, we cannot ensure that it will always work on either your end or our end. If the connection with a video visit is poor, the visit may have to be switched to a telephone visit. With either a video or telephone visit, we are not always able to ensure that we have a secure connection.  By engaging in this virtual visit, you consent to the provision of healthcare and authorize for your insurance to be billed (if applicable) for the services provided during this visit. Depending on your insurance coverage, you may receive a charge related to this service.  I need to obtain your verbal consent now. Are you willing to proceed with your visit today? Carl Rivera has provided verbal consent on 04/04/2024 for a virtual visit (video or telephone). Loa Lamp, FNP  Date: 04/04/2024 9:16 AM   Virtual Visit via Video Note   I, Loa Lamp, connected with  Carl Rivera  ([id number], September 01, 1980) on 04/04/24 at  9:15 AM EST by a video-enabled telemedicine application and verified that I am speaking with the correct person using two identifiers.  Location: Patient: Virtual Visit Location Patient: Home Provider:  Virtual Visit Location Provider: Home Office   I discussed the limitations of evaluation and management by telemedicine and the availability of in person appointments. The patient expressed understanding and agreed to proceed.    History of Present Illness: Carl Rivera is a 44 y.o. who identifies as a male who was assigned male at birth, and is being seen today for cough, fever, fatigue, aching, and his children have had the flu. His sx started 12 hrs ago. Carl Rivera  HPI: HPI  Problems:  Patient Active Problem List   Diagnosis Date Noted   Cerebrovascular dural arteriovenous fistula 12/23/2023   Dural arteriovenous fistula 12/23/2023   Bronchitis 06/13/2023   Sore throat 06/13/2023   Acute non-recurrent pansinusitis 06/13/2023   Strep throat 06/13/2023   Decreased mobility and endurance 05/19/2023   Prediabetes 11/27/2019   Overweight 12/24/2016   Benign neoplasm of colon 08/05/2013   Family history of malignant neoplasm of gastrointestinal tract 08/05/2013    Allergies: Allergies[1] Medications: Current Medications[2]  Observations/Objective: Patient is well-developed, well-nourished in no acute distress.  Resting comfortably  at home.  Head is normocephalic, atraumatic.  No labored breathing.  Speech is clear and coherent with logical content.  Patient is alert and oriented at baseline.    Assessment and Plan: There are no diagnoses linked to this encounter. Increase fluids, tylenol  or ibuprofen  as directed, humidifier at night, UC as needed.   Follow Up Instructions: I discussed the assessment and treatment plan with the patient. The  patient was provided an opportunity to ask questions and all were answered. The patient agreed with the plan and demonstrated an understanding of the instructions.  A copy of instructions were sent to the patient via MyChart unless otherwise noted below.     The patient was advised to call back or seek an in-person evaluation if the symptoms  worsen or if the condition fails to improve as anticipated.    Granite Godman, FNP     [1] No Known Allergies [2] No current outpatient medications on file.  "

## 2024-08-26 ENCOUNTER — Encounter: Admitting: Family Medicine
